# Patient Record
Sex: Male | Born: 1950 | Race: White | Hispanic: No | Marital: Married | Smoking: Former smoker
Health system: Southern US, Community
[De-identification: ages and names within clinical notes are randomized; demographics above are authoritative.]

## PROBLEM LIST (undated history)

## (undated) DIAGNOSIS — N400 Enlarged prostate without lower urinary tract symptoms: Secondary | ICD-10-CM

## (undated) DIAGNOSIS — M353 Polymyalgia rheumatica: Secondary | ICD-10-CM

## (undated) DIAGNOSIS — M1711 Unilateral primary osteoarthritis, right knee: Secondary | ICD-10-CM

## (undated) DIAGNOSIS — M1990 Unspecified osteoarthritis, unspecified site: Secondary | ICD-10-CM

## (undated) HISTORY — PX: MENISCECTOMY: SHX123

## (undated) HISTORY — PX: APPENDECTOMY: SHX54

---

## 2013-05-10 ENCOUNTER — Emergency Department (HOSPITAL_BASED_OUTPATIENT_CLINIC_OR_DEPARTMENT_OTHER)
Admission: EM | Admit: 2013-05-10 | Discharge: 2013-05-10 | Disposition: A | Discharge status: Home / Self Care | Payer: Managed Care, Other (non HMO) | Attending: Emergency Medicine | Admitting: Emergency Medicine

## 2013-05-10 DIAGNOSIS — S61209A Unspecified open wound of unspecified finger without damage to nail, initial encounter: Secondary | ICD-10-CM | POA: Insufficient documentation

## 2013-05-10 DIAGNOSIS — Z7982 Long term (current) use of aspirin: Secondary | ICD-10-CM | POA: Insufficient documentation

## 2013-05-10 DIAGNOSIS — W278XXA Contact with other nonpowered hand tool, initial encounter: Secondary | ICD-10-CM | POA: Insufficient documentation

## 2013-05-10 DIAGNOSIS — Y929 Unspecified place or not applicable: Secondary | ICD-10-CM | POA: Insufficient documentation

## 2013-05-10 DIAGNOSIS — Y939 Activity, unspecified: Secondary | ICD-10-CM | POA: Insufficient documentation

## 2013-05-10 DIAGNOSIS — S61210A Laceration without foreign body of right index finger without damage to nail, initial encounter: Secondary | ICD-10-CM

## 2013-05-10 DIAGNOSIS — Z880 Allergy status to penicillin: Secondary | ICD-10-CM | POA: Insufficient documentation

## 2013-05-10 NOTE — ED Provider Notes (Signed)
CSN: [id number]     Arrival date & time 05/10/13  1600 History   First MD Initiated Contact with Patient 05/10/13 1730     Chief Complaint  Patient presents with  . Laceration   (Consider location/radiation/quality/duration/timing/severity/associated sxs/prior Treatment) Patient is a 62 y.o. male presenting with skin laceration. The history is provided by the patient. No language interpreter was used.  Laceration Location:  Finger Finger laceration location:  R middle finger Length (cm):  0.4 Depth:  Cutaneous Bleeding: controlled   Injury mechanism: plastic edge. Pain details:    Severity:  No pain Relieved by:  Nothing   No past medical history on file. No past surgical history on file. No family history on file. History  Substance Use Topics  . Smoking status: Not on file  . Smokeless tobacco: Not on file  . Alcohol Use: Not on file    Review of Systems  All other systems reviewed and are negative.    Allergies  Amoxicillin  Home Medications   Current Outpatient Rx  Name  Route  Sig  Dispense  Refill  . aspirin 81 MG tablet   Oral   Take 81 mg by mouth daily.          BP 146/90  Pulse 57  Resp 18  SpO2 99% Physical Exam  Vitals reviewed. Constitutional: He is oriented to person, place, and time. He appears well-developed and well-nourished.  Musculoskeletal:  5mm laceration side of finger, no gapping, bleeding controlled  Neurological: He is alert and oriented to person, place, and time. He has normal reflexes.  Psychiatric: He has a normal mood and affect.    ED Course  LACERATION REPAIR Date/Time: 05/10/2013 5:48 PM Performed by: Elson Areas Authorized by: Elson Areas Consent: Verbal consent not obtained. Risks and benefits: risks, benefits and alternatives were discussed Patient identity confirmed: verbally with patient Laceration length: 0.5 cm Tendon involvement: none Nerve involvement: none Skin closure: glue Patient  tolerance: Patient tolerated the procedure well with no immediate complications.   (including critical care time) Labs Review Labs Reviewed - No data to display Imaging Review No results found.  EKG Interpretation   None       MDM   1. Laceration of right index finger w/o foreign body w/o damage to nail, initial encounter       Elson Areas, PA-C 05/10/13 1752

## 2013-05-10 NOTE — ED Notes (Signed)
Patient with small lac to right middle finger, states he came to the ER due to unable to control bleeding at home.

## 2013-05-10 NOTE — ED Notes (Signed)
Patient here with right hand middle finger laceration after cutting same with screw driver. No bleeding on arrival

## 2013-05-11 NOTE — ED Provider Notes (Signed)
Medical screening examination/treatment/procedure(s) were performed by non-physician practitioner and as supervising physician I was immediately available for consultation/collaboration.  EKG Interpretation   None        Shon Baton, MD 05/11/13 1447

## 2013-05-18 HISTORY — PX: ELBOW SURGERY: SHX618

## 2015-09-24 DIAGNOSIS — M1711 Unilateral primary osteoarthritis, right knee: Secondary | ICD-10-CM | POA: Diagnosis not present

## 2015-10-07 DIAGNOSIS — E782 Mixed hyperlipidemia: Secondary | ICD-10-CM | POA: Diagnosis not present

## 2015-12-30 DIAGNOSIS — L821 Other seborrheic keratosis: Secondary | ICD-10-CM | POA: Diagnosis not present

## 2015-12-30 DIAGNOSIS — L820 Inflamed seborrheic keratosis: Secondary | ICD-10-CM | POA: Diagnosis not present

## 2015-12-30 DIAGNOSIS — I781 Nevus, non-neoplastic: Secondary | ICD-10-CM | POA: Diagnosis not present

## 2016-08-03 DIAGNOSIS — R972 Elevated prostate specific antigen [PSA]: Secondary | ICD-10-CM | POA: Diagnosis not present

## 2016-08-03 DIAGNOSIS — N400 Enlarged prostate without lower urinary tract symptoms: Secondary | ICD-10-CM | POA: Diagnosis not present

## 2016-08-03 DIAGNOSIS — Z6830 Body mass index (BMI) 30.0-30.9, adult: Secondary | ICD-10-CM | POA: Diagnosis not present

## 2016-08-25 DIAGNOSIS — Z23 Encounter for immunization: Secondary | ICD-10-CM | POA: Diagnosis not present

## 2016-08-25 DIAGNOSIS — Z136 Encounter for screening for cardiovascular disorders: Secondary | ICD-10-CM | POA: Diagnosis not present

## 2016-08-25 DIAGNOSIS — R739 Hyperglycemia, unspecified: Secondary | ICD-10-CM | POA: Diagnosis not present

## 2016-08-25 DIAGNOSIS — E7800 Pure hypercholesterolemia, unspecified: Secondary | ICD-10-CM | POA: Diagnosis not present

## 2016-08-25 DIAGNOSIS — Z87891 Personal history of nicotine dependence: Secondary | ICD-10-CM | POA: Diagnosis not present

## 2016-08-25 DIAGNOSIS — Z0000 Encounter for general adult medical examination without abnormal findings: Secondary | ICD-10-CM | POA: Diagnosis not present

## 2016-08-25 DIAGNOSIS — Z1211 Encounter for screening for malignant neoplasm of colon: Secondary | ICD-10-CM | POA: Diagnosis not present

## 2016-08-25 DIAGNOSIS — R030 Elevated blood-pressure reading, without diagnosis of hypertension: Secondary | ICD-10-CM | POA: Diagnosis not present

## 2016-08-25 DIAGNOSIS — R05 Cough: Secondary | ICD-10-CM | POA: Diagnosis not present

## 2016-08-25 DIAGNOSIS — E669 Obesity, unspecified: Secondary | ICD-10-CM | POA: Diagnosis not present

## 2016-08-25 DIAGNOSIS — Z125 Encounter for screening for malignant neoplasm of prostate: Secondary | ICD-10-CM | POA: Diagnosis not present

## 2016-08-26 DIAGNOSIS — H524 Presbyopia: Secondary | ICD-10-CM | POA: Diagnosis not present

## 2016-08-26 DIAGNOSIS — H2513 Age-related nuclear cataract, bilateral: Secondary | ICD-10-CM | POA: Diagnosis not present

## 2016-08-26 DIAGNOSIS — H52203 Unspecified astigmatism, bilateral: Secondary | ICD-10-CM | POA: Diagnosis not present

## 2016-09-23 DIAGNOSIS — Z1211 Encounter for screening for malignant neoplasm of colon: Secondary | ICD-10-CM | POA: Diagnosis not present

## 2016-10-22 DIAGNOSIS — Z87891 Personal history of nicotine dependence: Secondary | ICD-10-CM | POA: Diagnosis not present

## 2016-10-28 DIAGNOSIS — M1711 Unilateral primary osteoarthritis, right knee: Secondary | ICD-10-CM | POA: Diagnosis not present

## 2016-12-17 DIAGNOSIS — M1711 Unilateral primary osteoarthritis, right knee: Secondary | ICD-10-CM | POA: Diagnosis not present

## 2016-12-23 DIAGNOSIS — M1711 Unilateral primary osteoarthritis, right knee: Secondary | ICD-10-CM | POA: Diagnosis not present

## 2016-12-30 DIAGNOSIS — D7589 Other specified diseases of blood and blood-forming organs: Secondary | ICD-10-CM | POA: Diagnosis not present

## 2016-12-30 DIAGNOSIS — M791 Myalgia: Secondary | ICD-10-CM | POA: Diagnosis not present

## 2016-12-31 DIAGNOSIS — M1711 Unilateral primary osteoarthritis, right knee: Secondary | ICD-10-CM | POA: Diagnosis not present

## 2017-01-28 DIAGNOSIS — M791 Myalgia: Secondary | ICD-10-CM | POA: Diagnosis not present

## 2017-01-28 DIAGNOSIS — Z79899 Other long term (current) drug therapy: Secondary | ICD-10-CM | POA: Diagnosis not present

## 2017-01-28 DIAGNOSIS — M2560 Stiffness of unspecified joint, not elsewhere classified: Secondary | ICD-10-CM | POA: Diagnosis not present

## 2017-01-28 DIAGNOSIS — Z9189 Other specified personal risk factors, not elsewhere classified: Secondary | ICD-10-CM | POA: Diagnosis not present

## 2017-01-28 DIAGNOSIS — M353 Polymyalgia rheumatica: Secondary | ICD-10-CM | POA: Diagnosis not present

## 2017-03-03 DIAGNOSIS — M1711 Unilateral primary osteoarthritis, right knee: Secondary | ICD-10-CM | POA: Diagnosis not present

## 2017-03-03 DIAGNOSIS — S80211A Abrasion, right knee, initial encounter: Secondary | ICD-10-CM | POA: Diagnosis not present

## 2017-03-22 DIAGNOSIS — M353 Polymyalgia rheumatica: Secondary | ICD-10-CM | POA: Diagnosis not present

## 2017-03-22 DIAGNOSIS — M1711 Unilateral primary osteoarthritis, right knee: Secondary | ICD-10-CM | POA: Diagnosis not present

## 2017-03-22 DIAGNOSIS — Z79899 Other long term (current) drug therapy: Secondary | ICD-10-CM | POA: Diagnosis not present

## 2017-03-22 DIAGNOSIS — Z7952 Long term (current) use of systemic steroids: Secondary | ICD-10-CM | POA: Diagnosis not present

## 2017-06-22 DIAGNOSIS — M79641 Pain in right hand: Secondary | ICD-10-CM | POA: Diagnosis not present

## 2017-06-22 DIAGNOSIS — M1812 Unilateral primary osteoarthritis of first carpometacarpal joint, left hand: Secondary | ICD-10-CM | POA: Diagnosis not present

## 2017-06-22 DIAGNOSIS — Z79899 Other long term (current) drug therapy: Secondary | ICD-10-CM | POA: Diagnosis not present

## 2017-06-22 DIAGNOSIS — M79642 Pain in left hand: Secondary | ICD-10-CM | POA: Diagnosis not present

## 2017-06-22 DIAGNOSIS — M1811 Unilateral primary osteoarthritis of first carpometacarpal joint, right hand: Secondary | ICD-10-CM | POA: Diagnosis not present

## 2017-06-22 DIAGNOSIS — M353 Polymyalgia rheumatica: Secondary | ICD-10-CM | POA: Diagnosis not present

## 2017-08-24 DIAGNOSIS — M1711 Unilateral primary osteoarthritis, right knee: Secondary | ICD-10-CM | POA: Diagnosis not present

## 2017-09-07 DIAGNOSIS — M1711 Unilateral primary osteoarthritis, right knee: Secondary | ICD-10-CM | POA: Diagnosis not present

## 2017-09-14 DIAGNOSIS — M1711 Unilateral primary osteoarthritis, right knee: Secondary | ICD-10-CM | POA: Diagnosis not present

## 2017-09-14 DIAGNOSIS — H2513 Age-related nuclear cataract, bilateral: Secondary | ICD-10-CM | POA: Diagnosis not present

## 2017-09-14 DIAGNOSIS — N138 Other obstructive and reflux uropathy: Secondary | ICD-10-CM | POA: Diagnosis not present

## 2017-09-14 DIAGNOSIS — H524 Presbyopia: Secondary | ICD-10-CM | POA: Diagnosis not present

## 2017-09-14 DIAGNOSIS — H52203 Unspecified astigmatism, bilateral: Secondary | ICD-10-CM | POA: Diagnosis not present

## 2017-09-14 DIAGNOSIS — R972 Elevated prostate specific antigen [PSA]: Secondary | ICD-10-CM | POA: Diagnosis not present

## 2017-09-14 DIAGNOSIS — N401 Enlarged prostate with lower urinary tract symptoms: Secondary | ICD-10-CM | POA: Diagnosis not present

## 2017-10-05 DIAGNOSIS — M150 Primary generalized (osteo)arthritis: Secondary | ICD-10-CM | POA: Diagnosis not present

## 2017-10-05 DIAGNOSIS — Z79899 Other long term (current) drug therapy: Secondary | ICD-10-CM | POA: Diagnosis not present

## 2017-10-05 DIAGNOSIS — M353 Polymyalgia rheumatica: Secondary | ICD-10-CM | POA: Diagnosis not present

## 2017-12-16 DIAGNOSIS — D1801 Hemangioma of skin and subcutaneous tissue: Secondary | ICD-10-CM | POA: Diagnosis not present

## 2017-12-16 DIAGNOSIS — L570 Actinic keratosis: Secondary | ICD-10-CM | POA: Diagnosis not present

## 2017-12-16 DIAGNOSIS — L821 Other seborrheic keratosis: Secondary | ICD-10-CM | POA: Diagnosis not present

## 2017-12-16 DIAGNOSIS — L814 Other melanin hyperpigmentation: Secondary | ICD-10-CM | POA: Diagnosis not present

## 2017-12-16 DIAGNOSIS — D225 Melanocytic nevi of trunk: Secondary | ICD-10-CM | POA: Diagnosis not present

## 2017-12-30 DIAGNOSIS — E669 Obesity, unspecified: Secondary | ICD-10-CM | POA: Diagnosis not present

## 2018-01-03 DIAGNOSIS — M353 Polymyalgia rheumatica: Secondary | ICD-10-CM | POA: Diagnosis not present

## 2018-01-03 DIAGNOSIS — Z79899 Other long term (current) drug therapy: Secondary | ICD-10-CM | POA: Diagnosis not present

## 2018-01-03 DIAGNOSIS — M150 Primary generalized (osteo)arthritis: Secondary | ICD-10-CM | POA: Diagnosis not present

## 2018-01-04 DIAGNOSIS — E7800 Pure hypercholesterolemia, unspecified: Secondary | ICD-10-CM | POA: Diagnosis not present

## 2018-01-04 DIAGNOSIS — Z0000 Encounter for general adult medical examination without abnormal findings: Secondary | ICD-10-CM | POA: Diagnosis not present

## 2018-01-04 DIAGNOSIS — Z23 Encounter for immunization: Secondary | ICD-10-CM | POA: Diagnosis not present

## 2018-01-04 DIAGNOSIS — Z6832 Body mass index (BMI) 32.0-32.9, adult: Secondary | ICD-10-CM | POA: Diagnosis not present

## 2018-01-04 DIAGNOSIS — N138 Other obstructive and reflux uropathy: Secondary | ICD-10-CM | POA: Diagnosis not present

## 2018-01-04 DIAGNOSIS — R739 Hyperglycemia, unspecified: Secondary | ICD-10-CM | POA: Diagnosis not present

## 2018-01-04 DIAGNOSIS — M353 Polymyalgia rheumatica: Secondary | ICD-10-CM | POA: Diagnosis not present

## 2018-01-04 DIAGNOSIS — E669 Obesity, unspecified: Secondary | ICD-10-CM | POA: Diagnosis not present

## 2018-01-04 DIAGNOSIS — N401 Enlarged prostate with lower urinary tract symptoms: Secondary | ICD-10-CM | POA: Diagnosis not present

## 2018-02-09 DIAGNOSIS — M1711 Unilateral primary osteoarthritis, right knee: Secondary | ICD-10-CM | POA: Diagnosis not present

## 2018-04-11 DIAGNOSIS — D485 Neoplasm of uncertain behavior of skin: Secondary | ICD-10-CM | POA: Diagnosis not present

## 2018-04-11 DIAGNOSIS — L820 Inflamed seborrheic keratosis: Secondary | ICD-10-CM | POA: Diagnosis not present

## 2018-04-11 DIAGNOSIS — L821 Other seborrheic keratosis: Secondary | ICD-10-CM | POA: Diagnosis not present

## 2018-04-11 NOTE — Pre-Procedure Instructions (Signed)
Frank Hudson  04/11/2018      [id number] - [address], [place] - [place] [address] Phone: [phone] Fax: [phone]    Your procedure is scheduled on 04/22/2018.  Report to [facility] Admitting at [place].M.  Call this number if you have problems the morning of surgery:  [phone]   Remember:  Do not eat or drink after midnight.    Take these medicines the morning of surgery with A SIP OF WATER: Dutasteride (avodart) - if it is a regularly scheduled day  7 days prior to surgery STOP taking any Aspirin(unless otherwise instructed by your surgeon), Aleve, Naproxen, Ibuprofen, Motrin, Advil, Goody's, BC's, all herbal medications, fish oil, and all vitamins     Do not wear jewelry  Do not wear lotions, powders, or colognes, or deodorant.  Men may shave face and neck.  Do not bring valuables to the hospital.  [facility] is not responsible for any belongings or valuables.  Contacts, eyeglasses, hearing aids, dentures or bridgework may not be worn into surgery.  Leave your suitcase in the car.  After surgery it may be brought to your room.  For patients admitted to the hospital, discharge time will be determined by your treatment team.  Patients discharged the day of surgery will not be allowed to drive home.   Name and phone number of your driver:    Special instructions:   [place]- Preparing For Surgery  Before surgery, you can play an important role. Because skin is not sterile, your skin needs to be as free of germs as possible. You can reduce the number of germs on your skin by washing with CHG (chlorahexidine gluconate) Soap before surgery.  CHG is an antiseptic cleaner which kills germs and bonds with the skin to continue killing germs even after washing.    Oral Hygiene is also important to reduce your risk of infection.  Remember - BRUSH YOUR TEETH THE MORNING OF SURGERY WITH YOUR REGULAR  TOOTHPASTE  Please do not use if you have an allergy to CHG or antibacterial soaps. If your skin becomes reddened/irritated stop using the CHG.  Do not shave (including legs and underarms) for at least 48 hours prior to first CHG shower. It is OK to shave your face.  Please follow these instructions carefully.   1. Shower the NIGHT BEFORE SURGERY and the MORNING OF SURGERY with CHG.   2. If you chose to wash your hair, wash your hair first as usual with your normal shampoo.  3. After you shampoo, rinse your hair and body thoroughly to remove the shampoo.  4. Use CHG as you would any other liquid soap. You can apply CHG directly to the skin and wash gently with a scrungie or a clean washcloth.   5. Apply the CHG Soap to your body ONLY FROM THE NECK DOWN.  Do not use on open wounds or open sores. Avoid contact with your eyes, ears, mouth and genitals (private parts). Wash Face and genitals (private parts)  with your normal soap.  6. Wash thoroughly, paying special attention to the area where your surgery will be performed.  7. Thoroughly rinse your body with warm water from the neck down.  8. DO NOT shower/wash with your normal soap after using and rinsing off the CHG Soap.  9. Pat yourself dry with a CLEAN TOWEL.  10. Wear CLEAN PAJAMAS to bed the night before surgery, wear comfortable  clothes the morning of surgery  11. Place CLEAN SHEETS on your bed the night of your first shower and DO NOT SLEEP WITH PETS.    Day of Surgery:  Do not apply any deodorants/lotions.  Please wear clean clothes to the hospital/surgery center.   Remember to brush your teeth WITH YOUR REGULAR TOOTHPASTE.    Please read over the following fact sheets that you were given.

## 2018-04-12 ENCOUNTER — Other Ambulatory Visit: Payer: Self-pay

## 2018-04-12 ENCOUNTER — Encounter (HOSPITAL_COMMUNITY): Payer: Self-pay

## 2018-04-12 ENCOUNTER — Encounter (HOSPITAL_COMMUNITY)
Admission: RE | Admit: 2018-04-12 | Discharge: 2018-04-12 | Disposition: A | Discharge status: Home / Self Care | Payer: PPO | Source: Ambulatory Visit | Attending: Orthopedic Surgery | Admitting: Orthopedic Surgery

## 2018-04-12 DIAGNOSIS — Z01812 Encounter for preprocedural laboratory examination: Secondary | ICD-10-CM | POA: Diagnosis not present

## 2018-04-12 DIAGNOSIS — M1711 Unilateral primary osteoarthritis, right knee: Secondary | ICD-10-CM | POA: Diagnosis not present

## 2018-04-12 HISTORY — DX: Polymyalgia rheumatica: M35.3

## 2018-04-12 HISTORY — DX: Unspecified osteoarthritis, unspecified site: M19.90

## 2018-04-12 HISTORY — DX: Benign prostatic hyperplasia without lower urinary tract symptoms: N40.0

## 2018-04-12 LAB — CBC
HCT: 46.4 % (ref 39.0–52.0)
Hemoglobin: 15.3 g/dL (ref 13.0–17.0)
MCH: 31.1 pg (ref 26.0–34.0)
MCHC: 33 g/dL (ref 30.0–36.0)
MCV: 94.3 fL (ref 80.0–100.0)
NRBC: 0 % (ref 0.0–0.2)
Platelets: 192 10*3/uL (ref 150–400)
RBC: 4.92 MIL/uL (ref 4.22–5.81)
RDW: 12.7 % (ref 11.5–15.5)
WBC: 6.1 10*3/uL (ref 4.0–10.5)

## 2018-04-12 LAB — SURGICAL PCR SCREEN
MRSA, PCR: NEGATIVE
Staphylococcus aureus: NEGATIVE

## 2018-04-12 NOTE — Progress Notes (Signed)
PCP - Dr. Jeralene Huff Cardiologist - patient denies  Chest x-ray - n/a EKG - n/a Stress Test - 10+ years ago, normal test, requiring no follow up per patient, not sure who ordered it or where it was done ECHO - patient denies Cardiac Cath - patient denies  Sleep Study - patient denies  Anesthesia review: n/a  Patient denies shortness of breath, fever, cough and chest pain at PAT appointment   Patient verbalized understanding of instructions that were given to them at the PAT appointment. Patient was also instructed that they will need to review over the PAT instructions again at home before surgery.

## 2018-04-13 NOTE — H&P (Signed)
Patient's anticipated LOS is less than 2 midnights, meeting these requirements: - Younger than 2 - Lives within 1 hour of care - Has a competent adult at home to recover with post-op recover - NO history of  - Chronic pain requiring opiods  - Diabetes  - Coronary Artery Disease  - Heart failure  - Heart attack  - Stroke  - DVT/VTE  - Cardiac arrhythmia  - Respiratory Failure/COPD  - Renal failure  - Anemia  - Advanced Liver disease       Frank Hudson is an 67 y.o. male.    Chief Complaint: right shoulder pain  HPI: Pt is a 67 y.o. male complaining of right knee pain for multiple years. Pain had continually increased since the beginning. X-rays in the clinic show end-stage arthritic changes of the right knee. Pt has tried various conservative treatments which have failed to alleviate their symptoms, including injections and therapy. Various options are discussed with the patient. Risks, benefits and expectations were discussed with the patient. Patient understand the risks, benefits and expectations and wishes to proceed with surgery.   PCP:  [name], MD  D/C Plans: Home  PMH: Past Medical History:  Diagnosis Date  . Arthritis   . BPH (benign prostatic hyperplasia)   . PMR (polymyalgia rheumatica) (HCC)     PSH: Past Surgical History:  Procedure Laterality Date  . APPENDECTOMY    . ELBOW SURGERY Left 2015  . MENISCECTOMY Right 1978?    Social History:  reports that he quit smoking about 15 years ago. His smoking use included cigarettes and cigars. He has never used smokeless tobacco. He reports that he drinks about 14.0 standard drinks of alcohol per week. His drug history is not on file.  Allergies:  Allergies  Allergen Reactions  . Amoxicillin Rash    Medications: No current facility-administered medications for this encounter.    Current Outpatient Medications  Medication Sig Dispense Refill  . dutasteride (AVODART) 0.5 MG capsule Take 0.5  mg by mouth every other day.    . ibuprofen (ADVIL,MOTRIN) 200 MG tablet Take 400-600 mg by mouth every 6 (six) hours as needed for headache or moderate pain.    . naproxen sodium (ALEVE) 220 MG tablet Take 220 mg by mouth daily as needed (for pain or headache).    . Multiple Vitamins-Minerals (MULTIVITAMIN PO) Take 1 tablet by mouth daily.      Results for orders placed or performed during the hospital encounter of 04/12/18 (from the past 48 hour(s))  Surgical pcr screen     Status: None   Collection Time: 04/12/18 10:37 AM  Result Value Ref Range   MRSA, PCR NEGATIVE NEGATIVE   Staphylococcus aureus NEGATIVE NEGATIVE    Comment: (NOTE) The Xpert SA Assay (FDA approved for NASAL specimens in patients 56 years of age and older), is one component of a comprehensive surveillance program. It is not intended to diagnose infection nor to guide or monitor treatment. Performed at [facility], [place] [address], [place] [address]   CBC     Status: None   Collection Time: 04/12/18 10:37 AM  Result Value Ref Range   WBC 6.1 4.0 - 10.5 K/uL   RBC 4.92 4.22 - 5.81 MIL/uL   Hemoglobin 15.3 13.0 - 17.0 g/dL   HCT 46.4 39.0 - 52.0 %   MCV 94.3 80.0 - 100.0 fL   MCH 31.1 26.0 - 34.0 pg   MCHC 33.0 30.0 - 36.0 g/dL  RDW 12.7 11.5 - 15.5 %   Platelets 192 150 - 400 K/uL   nRBC 0.0 0.0 - 0.2 %    Comment: Performed at [facility], [place] [address] [place], [place] [address]   No results found.  ROS: Pain with rom of the right lower extremity  Physical Exam: Alert and oriented 67 y.o. male in no acute distress Cranial nerves 2-12 intact Cervical spine: full rom with no tenderness, nv intact distally Chest: active breath sounds bilaterally, no wheeze rhonchi or rales Heart: regular rate and rhythm, no murmur Abd: non tender non distended with active bowel sounds Hip is stable with rom  Right knee moderate medial and lateral joint line tenderness nv intact distally No  rashes or edema Antalgic gait  Assessment/Plan Assessment: right knee end stage osteoarthritis  Plan:  Patient will undergo a right total knee by Dr. Veverly Fells at [facility]. Risks benefits and expectations were discussed with the patient. Patient understand risks, benefits and expectations and wishes to proceed. Preoperative templating of the joint replacement has been completed, documented, and submitted to the Operating Room personnel in order to optimize intra-operative equipment management.   [name] PA-C, MPAS [facility] is now [facility] [address], [place], [place], [place] [address] Phone: [phone] [email or web address] Facebook  [employer]

## 2018-04-21 MED ORDER — TRANEXAMIC ACID-NACL 1000-0.7 MG/100ML-% IV SOLN
1000.0000 mg | INTRAVENOUS | Status: AC
  Administered 2018-04-22: 1000 mg via INTRAVENOUS
  Filled 2018-04-21: qty 100

## 2018-04-22 ENCOUNTER — Encounter (HOSPITAL_COMMUNITY)
Admission: RE | Disposition: A | Discharge status: Home Health | Payer: Self-pay | Source: Home / Self Care | Attending: Orthopedic Surgery

## 2018-04-22 ENCOUNTER — Inpatient Hospital Stay (HOSPITAL_COMMUNITY): Payer: PPO | Admitting: Anesthesiology

## 2018-04-22 ENCOUNTER — Encounter (HOSPITAL_COMMUNITY): Payer: Self-pay

## 2018-04-22 ENCOUNTER — Inpatient Hospital Stay (HOSPITAL_COMMUNITY)
Admission: RE | Admit: 2018-04-22 | Discharge: 2018-04-24 | DRG: 470 | Disposition: A | Discharge status: Home Health | Payer: PPO | Attending: Orthopedic Surgery | Admitting: Orthopedic Surgery

## 2018-04-22 ENCOUNTER — Inpatient Hospital Stay (HOSPITAL_COMMUNITY): Payer: PPO

## 2018-04-22 ENCOUNTER — Other Ambulatory Visit: Payer: Self-pay

## 2018-04-22 DIAGNOSIS — N400 Enlarged prostate without lower urinary tract symptoms: Secondary | ICD-10-CM | POA: Diagnosis not present

## 2018-04-22 DIAGNOSIS — Z880 Allergy status to penicillin: Secondary | ICD-10-CM

## 2018-04-22 DIAGNOSIS — Z87891 Personal history of nicotine dependence: Secondary | ICD-10-CM

## 2018-04-22 DIAGNOSIS — M1711 Unilateral primary osteoarthritis, right knee: Secondary | ICD-10-CM | POA: Diagnosis not present

## 2018-04-22 DIAGNOSIS — Z79899 Other long term (current) drug therapy: Secondary | ICD-10-CM

## 2018-04-22 DIAGNOSIS — Z471 Aftercare following joint replacement surgery: Secondary | ICD-10-CM | POA: Diagnosis not present

## 2018-04-22 DIAGNOSIS — M25561 Pain in right knee: Secondary | ICD-10-CM | POA: Diagnosis present

## 2018-04-22 DIAGNOSIS — E669 Obesity, unspecified: Secondary | ICD-10-CM | POA: Diagnosis not present

## 2018-04-22 DIAGNOSIS — G8918 Other acute postprocedural pain: Secondary | ICD-10-CM | POA: Diagnosis not present

## 2018-04-22 DIAGNOSIS — Z9049 Acquired absence of other specified parts of digestive tract: Secondary | ICD-10-CM

## 2018-04-22 DIAGNOSIS — I4819 Other persistent atrial fibrillation: Secondary | ICD-10-CM | POA: Diagnosis not present

## 2018-04-22 DIAGNOSIS — R110 Nausea: Secondary | ICD-10-CM | POA: Diagnosis not present

## 2018-04-22 DIAGNOSIS — Z6830 Body mass index (BMI) 30.0-30.9, adult: Secondary | ICD-10-CM | POA: Diagnosis not present

## 2018-04-22 DIAGNOSIS — Z96651 Presence of right artificial knee joint: Secondary | ICD-10-CM | POA: Diagnosis not present

## 2018-04-22 DIAGNOSIS — I4891 Unspecified atrial fibrillation: Secondary | ICD-10-CM | POA: Diagnosis not present

## 2018-04-22 HISTORY — DX: Unilateral primary osteoarthritis, right knee: M17.11

## 2018-04-22 HISTORY — PX: TOTAL KNEE ARTHROPLASTY: SHX125

## 2018-04-22 SURGERY — ARTHROPLASTY, KNEE, TOTAL
Anesthesia: Spinal | Site: Knee | Laterality: Right

## 2018-04-22 MED ORDER — FENTANYL CITRATE (PF) 100 MCG/2ML IJ SOLN
INTRAMUSCULAR | Status: AC
  Administered 2018-04-22: 100 ug via INTRAVENOUS
  Filled 2018-04-22: qty 2

## 2018-04-22 MED ORDER — ASPIRIN 81 MG PO CHEW
81.0000 mg | CHEWABLE_TABLET | Freq: Two times a day (BID) | ORAL | Status: DC
  Administered 2018-04-22 – 2018-04-24 (×4): 81 mg via ORAL
  Filled 2018-04-22 (×4): qty 1

## 2018-04-22 MED ORDER — HYDROMORPHONE HCL 1 MG/ML IJ SOLN: 0.2500 mg | INTRAMUSCULAR | Status: DC | PRN

## 2018-04-22 MED ORDER — HYDROMORPHONE HCL 1 MG/ML IJ SOLN
0.5000 mg | INTRAMUSCULAR | Status: DC | PRN
  Administered 2018-04-22 – 2018-04-23 (×6): 1 mg via INTRAVENOUS
  Filled 2018-04-22 (×6): qty 1

## 2018-04-22 MED ORDER — FENTANYL CITRATE (PF) 250 MCG/5ML IJ SOLN
INTRAMUSCULAR | Status: AC
  Filled 2018-04-22: qty 5

## 2018-04-22 MED ORDER — TRANEXAMIC ACID-NACL 1000-0.7 MG/100ML-% IV SOLN
1000.0000 mg | Freq: Once | INTRAVENOUS | Status: AC
  Administered 2018-04-22: 1000 mg via INTRAVENOUS
  Filled 2018-04-22: qty 100

## 2018-04-22 MED ORDER — PROPOFOL 500 MG/50ML IV EMUL
INTRAVENOUS | Status: DC | PRN
  Administered 2018-04-22: 125 ug/kg/min via INTRAVENOUS

## 2018-04-22 MED ORDER — DOCUSATE SODIUM 100 MG PO CAPS
100.0000 mg | ORAL_CAPSULE | Freq: Two times a day (BID) | ORAL | Status: DC
  Administered 2018-04-22 – 2018-04-24 (×4): 100 mg via ORAL
  Filled 2018-04-22 (×5): qty 1

## 2018-04-22 MED ORDER — METHOCARBAMOL 1000 MG/10ML IJ SOLN
500.0000 mg | Freq: Four times a day (QID) | INTRAVENOUS | Status: DC | PRN
  Filled 2018-04-22: qty 5

## 2018-04-22 MED ORDER — BUPIVACAINE IN DEXTROSE 0.75-8.25 % IT SOLN
INTRATHECAL | Status: DC | PRN
  Administered 2018-04-22: 1.6 mL via INTRATHECAL

## 2018-04-22 MED ORDER — POLYETHYLENE GLYCOL 3350 17 G PO PACK: 17.0000 g | PACK | Freq: Every day | ORAL | Status: DC | PRN

## 2018-04-22 MED ORDER — MENTHOL 3 MG MT LOZG: 1.0000 | LOZENGE | OROMUCOSAL | Status: DC | PRN

## 2018-04-22 MED ORDER — ROPIVACAINE HCL 7.5 MG/ML IJ SOLN
INTRAMUSCULAR | Status: DC | PRN
  Administered 2018-04-22 (×4): 5 mL via PERINEURAL

## 2018-04-22 MED ORDER — OXYCODONE HCL 5 MG PO TABS
5.0000 mg | ORAL_TABLET | ORAL | Status: DC | PRN
  Administered 2018-04-22: 5 mg via ORAL
  Administered 2018-04-22 – 2018-04-23 (×4): 10 mg via ORAL
  Filled 2018-04-22 (×3): qty 2
  Filled 2018-04-22: qty 1
  Filled 2018-04-22: qty 2

## 2018-04-22 MED ORDER — METHOCARBAMOL 500 MG PO TABS
500.0000 mg | ORAL_TABLET | Freq: Four times a day (QID) | ORAL | Status: DC | PRN
  Administered 2018-04-22 – 2018-04-23 (×4): 500 mg via ORAL
  Filled 2018-04-22 (×4): qty 1

## 2018-04-22 MED ORDER — DUTASTERIDE 0.5 MG PO CAPS
0.5000 mg | ORAL_CAPSULE | ORAL | Status: DC
  Administered 2018-04-24: 0.5 mg via ORAL
  Filled 2018-04-22: qty 1

## 2018-04-22 MED ORDER — LACTATED RINGERS IV SOLN
INTRAVENOUS | Status: DC
  Administered 2018-04-22 (×2): via INTRAVENOUS

## 2018-04-22 MED ORDER — ASPIRIN 81 MG PO CHEW: 81.0000 mg | CHEWABLE_TABLET | Freq: Two times a day (BID) | ORAL | 0 refills | Status: DC

## 2018-04-22 MED ORDER — PROMETHAZINE HCL 25 MG/ML IJ SOLN: 6.2500 mg | INTRAMUSCULAR | Status: DC | PRN

## 2018-04-22 MED ORDER — FERROUS SULFATE 325 (65 FE) MG PO TABS
325.0000 mg | ORAL_TABLET | Freq: Three times a day (TID) | ORAL | Status: DC
  Administered 2018-04-22 – 2018-04-24 (×6): 325 mg via ORAL
  Filled 2018-04-22 (×6): qty 1

## 2018-04-22 MED ORDER — CHLORHEXIDINE GLUCONATE 4 % EX LIQD: 60.0000 mL | Freq: Once | CUTANEOUS | Status: DC

## 2018-04-22 MED ORDER — METOCLOPRAMIDE HCL 5 MG/ML IJ SOLN
5.0000 mg | Freq: Three times a day (TID) | INTRAMUSCULAR | Status: DC | PRN
  Administered 2018-04-23: 10 mg via INTRAVENOUS
  Filled 2018-04-22: qty 2

## 2018-04-22 MED ORDER — ACETAMINOPHEN 325 MG PO TABS: 325.0000 mg | ORAL_TABLET | Freq: Four times a day (QID) | ORAL | Status: DC | PRN

## 2018-04-22 MED ORDER — SODIUM CHLORIDE 0.9 % IV SOLN
INTRAVENOUS | Status: DC | PRN
  Administered 2018-04-22: 25 ug/min via INTRAVENOUS

## 2018-04-22 MED ORDER — ONDANSETRON HCL 4 MG/2ML IJ SOLN
INTRAMUSCULAR | Status: DC | PRN
  Administered 2018-04-22: 4 mg via INTRAVENOUS

## 2018-04-22 MED ORDER — MEPERIDINE HCL 50 MG/ML IJ SOLN: 6.2500 mg | INTRAMUSCULAR | Status: DC | PRN

## 2018-04-22 MED ORDER — ONDANSETRON HCL 4 MG/2ML IJ SOLN
4.0000 mg | Freq: Four times a day (QID) | INTRAMUSCULAR | Status: DC | PRN
  Administered 2018-04-23: 4 mg via INTRAVENOUS
  Filled 2018-04-22: qty 2

## 2018-04-22 MED ORDER — NAPROXEN SODIUM 275 MG PO TABS
275.0000 mg | ORAL_TABLET | Freq: Every day | ORAL | Status: DC | PRN
  Filled 2018-04-22: qty 1

## 2018-04-22 MED ORDER — ADULT MULTIVITAMIN LIQUID CH
Freq: Every day | ORAL | Status: DC
  Administered 2018-04-22 – 2018-04-24 (×2): 15 mL via ORAL
  Filled 2018-04-22 (×3): qty 15

## 2018-04-22 MED ORDER — CLINDAMYCIN PHOSPHATE 900 MG/50ML IV SOLN
900.0000 mg | INTRAVENOUS | Status: AC
  Administered 2018-04-22: 900 mg via INTRAVENOUS
  Filled 2018-04-22: qty 50

## 2018-04-22 MED ORDER — PHENOL 1.4 % MT LIQD: 1.0000 | OROMUCOSAL | Status: DC | PRN

## 2018-04-22 MED ORDER — CLINDAMYCIN PHOSPHATE 900 MG/50ML IV SOLN
900.0000 mg | Freq: Three times a day (TID) | INTRAVENOUS | Status: AC
  Administered 2018-04-22 – 2018-04-23 (×3): 900 mg via INTRAVENOUS
  Filled 2018-04-22 (×3): qty 50

## 2018-04-22 MED ORDER — METOCLOPRAMIDE HCL 5 MG PO TABS: 5.0000 mg | ORAL_TABLET | Freq: Three times a day (TID) | ORAL | Status: DC | PRN

## 2018-04-22 MED ORDER — OXYCODONE-ACETAMINOPHEN 5-325 MG PO TABS: 1.0000 | ORAL_TABLET | ORAL | 0 refills | Status: DC | PRN

## 2018-04-22 MED ORDER — ONDANSETRON HCL 4 MG PO TABS: 4.0000 mg | ORAL_TABLET | Freq: Four times a day (QID) | ORAL | Status: DC | PRN

## 2018-04-22 MED ORDER — PROPOFOL 10 MG/ML IV BOLUS
INTRAVENOUS | Status: AC
  Filled 2018-04-22: qty 20

## 2018-04-22 MED ORDER — BISACODYL 10 MG RE SUPP: 10.0000 mg | Freq: Every day | RECTAL | Status: DC | PRN

## 2018-04-22 MED ORDER — PROPOFOL 1000 MG/100ML IV EMUL
INTRAVENOUS | Status: AC
  Filled 2018-04-22: qty 200

## 2018-04-22 MED ORDER — APIXABAN 5 MG PO TABS
5.0000 mg | ORAL_TABLET | Freq: Two times a day (BID) | ORAL | Status: DC
  Administered 2018-04-23 – 2018-04-24 (×3): 5 mg via ORAL
  Filled 2018-04-22 (×3): qty 1

## 2018-04-22 MED ORDER — FENTANYL CITRATE (PF) 100 MCG/2ML IJ SOLN
100.0000 ug | Freq: Once | INTRAMUSCULAR | Status: AC
  Administered 2018-04-22: 100 ug via INTRAVENOUS

## 2018-04-22 MED ORDER — KETOROLAC TROMETHAMINE 30 MG/ML IJ SOLN: 30.0000 mg | Freq: Once | INTRAMUSCULAR | Status: DC | PRN

## 2018-04-22 MED ORDER — METHOCARBAMOL 500 MG PO TABS: 500.0000 mg | ORAL_TABLET | Freq: Three times a day (TID) | ORAL | 1 refills | Status: DC | PRN

## 2018-04-22 MED ORDER — SODIUM CHLORIDE 0.9 % IV SOLN
INTRAVENOUS | Status: DC
  Administered 2018-04-22 – 2018-04-23 (×2): via INTRAVENOUS

## 2018-04-22 MED ORDER — ROPIVACAINE HCL 5 MG/ML IJ SOLN
INTRAMUSCULAR | Status: DC | PRN
  Administered 2018-04-22 (×2): 5 mL via PERINEURAL

## 2018-04-22 MED ORDER — 0.9 % SODIUM CHLORIDE (POUR BTL) OPTIME
TOPICAL | Status: DC | PRN
  Administered 2018-04-22: 1000 mL

## 2018-04-22 MED ORDER — ONDANSETRON HCL 4 MG/2ML IJ SOLN
INTRAMUSCULAR | Status: AC
  Filled 2018-04-22: qty 2

## 2018-04-22 MED ORDER — MIDAZOLAM HCL 2 MG/2ML IJ SOLN
2.0000 mg | Freq: Once | INTRAMUSCULAR | Status: AC
  Administered 2018-04-22: 2 mg via INTRAVENOUS

## 2018-04-22 MED ORDER — SODIUM CHLORIDE 0.9 % IR SOLN
Status: DC | PRN
  Administered 2018-04-22: 3000 mL

## 2018-04-22 MED ORDER — MIDAZOLAM HCL 2 MG/2ML IJ SOLN
INTRAMUSCULAR | Status: AC
  Administered 2018-04-22: 2 mg via INTRAVENOUS
  Filled 2018-04-22: qty 2

## 2018-04-22 SURGICAL SUPPLY — 60 items
BANDAGE ESMARK 6X9 LF (GAUZE/BANDAGES/DRESSINGS) ×1 IMPLANT
BLADE SAG 18X100X1.27 (BLADE) ×3 IMPLANT
BLADE SAW SGTL 13X75X1.27 (BLADE) ×3 IMPLANT
BNDG ELASTIC 6X10 VLCR STRL LF (GAUZE/BANDAGES/DRESSINGS) ×3 IMPLANT
BNDG ESMARK 6X9 LF (GAUZE/BANDAGES/DRESSINGS) ×3
BNDG GAUZE ELAST 4 BULKY (GAUZE/BANDAGES/DRESSINGS) ×3 IMPLANT
BOWL SMART MIX CTS (DISPOSABLE) ×3 IMPLANT
CEMENT HV SMART SET (Cement) ×6 IMPLANT
CEMENT TIBIA MBT SIZE 5 (Knees) ×1 IMPLANT
CLOSURE WOUND 1/2 X4 (GAUZE/BANDAGES/DRESSINGS) ×1
COVER SURGICAL LIGHT HANDLE (MISCELLANEOUS) ×3 IMPLANT
COVER WAND RF STERILE (DRAPES) ×3 IMPLANT
CUFF TOURNIQUET SINGLE 34IN LL (TOURNIQUET CUFF) IMPLANT
CUFF TOURNIQUET SINGLE 44IN (TOURNIQUET CUFF) IMPLANT
DRAPE EXTREMITY T 121X128X90 (DRAPE) ×3 IMPLANT
DRAPE HALF SHEET 40X57 (DRAPES) ×3 IMPLANT
DRAPE U-SHAPE 47X51 STRL (DRAPES) ×3 IMPLANT
DRSG ADAPTIC 3X8 NADH LF (GAUZE/BANDAGES/DRESSINGS) ×3 IMPLANT
DRSG PAD ABDOMINAL 8X10 ST (GAUZE/BANDAGES/DRESSINGS) ×6 IMPLANT
DURAPREP 26ML APPLICATOR (WOUND CARE) ×3 IMPLANT
ELECT CAUTERY BLADE 6.4 (BLADE) ×3 IMPLANT
ELECT REM PT RETURN 9FT ADLT (ELECTROSURGICAL) ×3
ELECTRODE REM PT RTRN 9FT ADLT (ELECTROSURGICAL) ×1 IMPLANT
FEMUR SIGMA PS SZ 5.0 R (Femur) ×3 IMPLANT
GAUZE SPONGE 4X4 12PLY STRL (GAUZE/BANDAGES/DRESSINGS) ×3 IMPLANT
GLOVE BIOGEL PI ORTHO PRO 7.5 (GLOVE) ×2
GLOVE BIOGEL PI ORTHO PRO SZ8 (GLOVE) ×2
GLOVE ORTHO TXT STRL SZ7.5 (GLOVE) ×3 IMPLANT
GLOVE PI ORTHO PRO STRL 7.5 (GLOVE) ×1 IMPLANT
GLOVE PI ORTHO PRO STRL SZ8 (GLOVE) ×1 IMPLANT
GLOVE SURG ORTHO 8.5 STRL (GLOVE) ×3 IMPLANT
GOWN STRL REUS W/ TWL XL LVL3 (GOWN DISPOSABLE) ×3 IMPLANT
GOWN STRL REUS W/TWL XL LVL3 (GOWN DISPOSABLE) ×6
HANDPIECE INTERPULSE COAX TIP (DISPOSABLE) ×2
IMMOBILIZER KNEE 22 UNIV (SOFTGOODS) ×3 IMPLANT
KIT BASIN OR (CUSTOM PROCEDURE TRAY) ×3 IMPLANT
KIT TURNOVER KIT B (KITS) ×3 IMPLANT
MANIFOLD NEPTUNE II (INSTRUMENTS) ×3 IMPLANT
NS IRRIG 1000ML POUR BTL (IV SOLUTION) ×3 IMPLANT
PACK TOTAL JOINT (CUSTOM PROCEDURE TRAY) ×3 IMPLANT
PAD ABD 8X10 STRL (GAUZE/BANDAGES/DRESSINGS) ×3 IMPLANT
PAD ARMBOARD 7.5X6 YLW CONV (MISCELLANEOUS) ×6 IMPLANT
PATELLA DOME PFC 38MM (Knees) ×3 IMPLANT
PIN STEINMAN FIXATION KNEE (PIN) ×6 IMPLANT
PLATE ROT INSERT 12.5MM (Plate) ×3 IMPLANT
SET HNDPC FAN SPRY TIP SCT (DISPOSABLE) ×1 IMPLANT
STRIP CLOSURE SKIN 1/2X4 (GAUZE/BANDAGES/DRESSINGS) ×2 IMPLANT
SUCTION FRAZIER HANDLE 10FR (MISCELLANEOUS) ×2
SUCTION TUBE FRAZIER 10FR DISP (MISCELLANEOUS) ×1 IMPLANT
SUT MNCRL AB 3-0 PS2 18 (SUTURE) ×3 IMPLANT
SUT VIC AB 0 CT1 27 (SUTURE) ×4
SUT VIC AB 0 CT1 27XBRD ANBCTR (SUTURE) ×2 IMPLANT
SUT VIC AB 1 CT1 27 (SUTURE) ×6
SUT VIC AB 1 CT1 27XBRD ANBCTR (SUTURE) ×3 IMPLANT
SUT VIC AB 2-0 CT1 27 (SUTURE) ×4
SUT VIC AB 2-0 CT1 TAPERPNT 27 (SUTURE) ×2 IMPLANT
TIBIA MBT CEMENT SIZE 5 (Knees) ×3 IMPLANT
TOWEL OR 17X26 10 PK STRL BLUE (TOWEL DISPOSABLE) ×3 IMPLANT
TRAY CATH 16FR W/PLASTIC CATH (SET/KITS/TRAYS/PACK) IMPLANT
TRAY FOLEY MTR SLVR 16FR STAT (SET/KITS/TRAYS/PACK) IMPLANT

## 2018-04-22 NOTE — Brief Op Note (Signed)
04/22/2018  12:58 PM  PATIENT:  Frank Hudson  67 y.o. male  PRE-OPERATIVE DIAGNOSIS:  Right knee end stage osteoarthritis  POST-OPERATIVE DIAGNOSIS:  Right knee end stage osteoarthritis  PROCEDURE:  Procedure(s): RIGHT TOTAL KNEE ARTHROPLASTY (Right) DePuy Sigma RP  SURGEON:  Surgeon(s) and Role:    [name], MD - Primary  PHYSICIAN ASSISTANT:   ASSISTANTS: Ventura Bruns, PA-C   ANESTHESIA:   regional and spinal  EBL:  50 mL   BLOOD ADMINISTERED:none  DRAINS: none   LOCAL MEDICATIONS USED:  NONE  SPECIMEN:  No Specimen  DISPOSITION OF SPECIMEN:  N/A  COUNTS:  YES  TOURNIQUET:   Total Tourniquet Time Documented: Thigh (Right) - 104 minutes Total: Thigh (Right) - 104 minutes   DICTATION: .Other Dictation: Dictation Number [id number]  PLAN OF CARE: Admit to inpatient   PATIENT DISPOSITION:  PACU - hemodynamically stable.   Delay start of Pharmacological VTE agent (>24hrs) due to surgical blood loss or risk of bleeding: no

## 2018-04-22 NOTE — Plan of Care (Signed)
  Problem: Pain Managment: Goal: General experience of comfort will improve Outcome: Progressing   Problem: Safety: Goal: Ability to remain free from injury will improve Outcome: Progressing   

## 2018-04-22 NOTE — Anesthesia Postprocedure Evaluation (Signed)
Anesthesia Post Note  Patient: Frank Hudson  Procedure(s) Performed: RIGHT TOTAL KNEE ARTHROPLASTY (Right Knee)     Patient location during evaluation: PACU Anesthesia Type: Spinal Level of consciousness: awake Pain management: pain level controlled Vital Signs Assessment: post-procedure vital signs reviewed and stable Respiratory status: spontaneous breathing Cardiovascular status: stable Postop Assessment: no headache, no backache, spinal receding, patient able to bend at knees and no apparent nausea or vomiting Anesthetic complications: no    Last Vitals:  Vitals:   04/22/18 1310 04/22/18 1325  BP: 92/68 96/66  Pulse: 70 62  Resp: 13 12  Temp:    SpO2: 97% 98%    Last Pain:  Vitals:   04/22/18 1325  TempSrc:   PainSc: 0-No pain   Pain Goal:    LLE Motor Response: Purposeful movement;Responds to commands (04/22/18 1325) LLE Sensation: Numbness (04/22/18 1325) RLE Motor Response: Purposeful movement;Responds to commands (04/22/18 1325) RLE Sensation: Numbness (04/22/18 1325) L Sensory Level: S1-Sole of foot, small toes (04/22/18 1325) R Sensory Level: S1-Sole of foot, small toes (04/22/18 1325)  Huston Foley

## 2018-04-22 NOTE — Evaluation (Signed)
Physical Therapy Evaluation Patient Details Name: Frank Hudson MRN: [id number] DOB: 1950-07-16 Today's Date: 04/22/2018   History of Present Illness  Pt is a 67 y/o male s/p elective R TKA. Prior to surgery, found to have a fib, however, per notes is rate controlled and pt tolerated procedure well. PMH includes polymyalgia rheumatica.   Clinical Impression  Pt admitted secondary to problem above with deficits below. Gait limited to within the room secondary to pain. Required min guard A for mobility using RW this session. HR ranging from mid 70s to low 80s throughout. Educated about knee precautions and supine HEP. Will continue to follow acutely to maximize functional mobility independence and safety.     Follow Up Recommendations Follow surgeon's recommendation for DC plan and follow-up therapies;Supervision for mobility/OOB    Equipment Recommendations  None recommended by PT    Recommendations for Other Services       Precautions / Restrictions Precautions Precautions: Knee Precaution Booklet Issued: Yes (comment) Precaution Comments: Reviewed knee precautions with pt.  Required Braces or Orthoses: Knee Immobilizer - Right Knee Immobilizer - Right: Other (comment)(until discontinued) Restrictions Weight Bearing Restrictions: Yes RLE Weight Bearing: Weight bearing as tolerated      Mobility  Bed Mobility Overal bed mobility: Needs Assistance Bed Mobility: Supine to Sit     Supine to sit: Min assist     General bed mobility comments: Min A for RLE assist to come to EOB   Transfers Overall transfer level: Needs assistance Equipment used: Rolling walker (2 wheeled) Transfers: Sit to/from Stand Sit to Stand: Min guard         General transfer comment: Min guard for steadying assist. Cues for safe hand placement.   Ambulation/Gait Ambulation/Gait assistance: Min guard Gait Distance (Feet): 20 Feet Assistive device: Rolling walker (2 wheeled) Gait  Pattern/deviations: Step-to pattern;Decreased step length - right;Decreased step length - left;Decreased weight shift to right;Antalgic Gait velocity: Decreased    General Gait Details: Slow, moderately antalgic gait. Mild buckling noted, even with use of KI. Gait limited to within the room secondary to pain. Cues for sequencing using RW.   Stairs            Wheelchair Mobility    Modified Rankin (Stroke Patients Only)       Balance Overall balance assessment: Needs assistance Sitting-balance support: No upper extremity supported;Feet supported Sitting balance-Leahy Scale: Good     Standing balance support: Bilateral upper extremity supported;During functional activity Standing balance-Leahy Scale: Poor Standing balance comment: Reliant on BUE support.                              Pertinent Vitals/Pain Pain Assessment: Faces Faces Pain Scale: Hurts even more Pain Location: R knee  Pain Descriptors / Indicators: Aching;Operative site guarding;Grimacing;Guarding Pain Intervention(s): Limited activity within patient's tolerance;Monitored during session;Repositioned    Home Living Family/patient expects to be discharged to:: Private residence Living Arrangements: Spouse/significant other Available Help at Discharge: Family;Available 24 hours/day Type of Home: House Home Access: Level entry     Home Layout: Two level;Able to live on main level with bedroom/bathroom Home Equipment: Gilford Rile - 2 wheels;Bedside commode;Shower seat      Prior Function Level of Independence: Independent               Hand Dominance        Extremity/Trunk Assessment   Upper Extremity Assessment Upper Extremity Assessment: Overall WFL for tasks assessed  Lower Extremity Assessment Lower Extremity Assessment: RLE deficits/detail RLE Deficits / Details: Deficits consistent with post op pain and weakness.     Cervical / Trunk Assessment Cervical / Trunk  Assessment: Normal  Communication   Communication: No difficulties  Cognition Arousal/Alertness: Awake/alert Behavior During Therapy: WFL for tasks assessed/performed Overall Cognitive Status: Within Functional Limits for tasks assessed                                        General Comments General comments (skin integrity, edema, etc.): Pt's wife present during session.     Exercises Total Joint Exercises Ankle Circles/Pumps: AROM;Both;10 reps;Supine Quad Sets: AROM;Right;10 reps;Supine   Assessment/Plan    PT Assessment Patient needs continued PT services  PT Problem List Decreased strength;Decreased range of motion;Decreased balance;Decreased activity tolerance;Decreased mobility;Decreased knowledge of use of DME;Decreased knowledge of precautions;Pain       PT Treatment Interventions DME instruction;Gait training;Functional mobility training;Therapeutic activities;Therapeutic exercise;Balance training;Patient/family education    PT Goals (Current goals can be found in the Care Plan section)  Acute Rehab PT Goals Patient Stated Goal: to go home PT Goal Formulation: With patient Time For Goal Achievement: 05/06/18 Potential to Achieve Goals: Good    Frequency 7X/week   Barriers to discharge        Co-evaluation               AM-PAC PT "6 Clicks" Mobility  Outcome Measure Help needed turning from your back to your side while in a flat bed without using bedrails?: A Little Help needed moving from lying on your back to sitting on the side of a flat bed without using bedrails?: A Little Help needed moving to and from a bed to a chair (including a wheelchair)?: A Little Help needed standing up from a chair using your arms (e.g., wheelchair or bedside chair)?: A Little Help needed to walk in hospital room?: A Little Help needed climbing 3-5 steps with a railing? : A Lot 6 Click Score: 17    End of Session Equipment Utilized During Treatment: Gait  belt;Right knee immobilizer Activity Tolerance: Patient limited by pain Patient left: in chair;with call bell/phone within reach;with family/visitor present Nurse Communication: Mobility status PT Visit Diagnosis: Other abnormalities of gait and mobility (R26.89);Muscle weakness (generalized) (M62.81);Pain Pain - Right/Left: Right Pain - part of body: Knee    Time: 0737-1062 PT Time Calculation (min) (ACUTE ONLY): 26 min   Charges:   PT Evaluation $PT Eval Low Complexity: 1 Low PT Treatments $Therapeutic Activity: 8-22 mins        Leighton Ruff, PT, DPT  Acute Rehabilitation Services  Pager: [phone] Office: [phone]   Rudean Hitt 04/22/2018, 6:36 PM

## 2018-04-22 NOTE — Plan of Care (Signed)

## 2018-04-22 NOTE — Interval H&P Note (Signed)
History and Physical Interval Note:  04/22/2018 10:11 AM  Frank Hudson  has presented today for surgery, with the diagnosis of Right knee end stage osteoarthritis  The various methods of treatment have been discussed with the patient and family. After consideration of risks, benefits and other options for treatment, the patient has consented to  Procedure(s): TOTAL KNEE ARTHROPLASTY (Right) as a surgical intervention .  The patient's history has been reviewed, patient examined, no change in status, stable for surgery.  I have reviewed the patient's chart and labs.  Questions were answered to the patient's satisfaction.     Chaun Uemura,STEVEN R

## 2018-04-22 NOTE — Progress Notes (Signed)
Pt arrived to room 5N13 after surgery. Received report from [place], [place] in PACU. See assessment. Will continue to monitor.

## 2018-04-22 NOTE — Anesthesia Procedure Notes (Signed)
Procedure Name: MAC Date/Time: 04/22/2018 10:45 AM Performed by: Kyung Rudd, CRNA Pre-anesthesia Checklist: Patient identified, Emergency Drugs available, Suction available and Patient being monitored Patient Re-evaluated:Patient Re-evaluated prior to induction Oxygen Delivery Method: Simple face mask Induction Type: IV induction Placement Confirmation: positive ETCO2

## 2018-04-22 NOTE — Anesthesia Procedure Notes (Signed)
Spinal  Patient location during procedure: OR Start time: 04/22/2018 10:39 AM End time: 04/22/2018 10:43 AM Staffing Anesthesiologist: Lyn Hollingshead, MD Performed: anesthesiologist  Preanesthetic Checklist Completed: patient identified, site marked, surgical consent, pre-op evaluation, timeout performed, IV checked, risks and benefits discussed and monitors and equipment checked Spinal Block Patient position: sitting Prep: site prepped and draped and DuraPrep Patient monitoring: continuous pulse ox and blood pressure Approach: midline Location: L3-4 Injection technique: single-shot Needle Needle type: Pencan  Needle gauge: 24 G Needle length: 10 cm Needle insertion depth: 6 cm Assessment Sensory level: T8

## 2018-04-22 NOTE — Anesthesia Preprocedure Evaluation (Addendum)
Anesthesia Evaluation  Patient identified by MRN, date of birth, ID band Patient awake    Reviewed: Allergy & Precautions, NPO status , Patient's Chart, lab work & pertinent test results  Airway Mallampati: I       Dental no notable dental hx. (+) Teeth Intact, Dental Advisory Given   Pulmonary former smoker,    Pulmonary exam normal breath sounds clear to auscultation       Cardiovascular negative cardio ROS Normal cardiovascular exam Rhythm:Regular Rate:Normal     Neuro/Psych negative neurological ROS  negative psych ROS   GI/Hepatic negative GI ROS, Neg liver ROS,   Endo/Other  negative endocrine ROS  Renal/GU negative Renal ROS  negative genitourinary   Musculoskeletal   Abdominal (+) + obese,   Peds  Hematology negative hematology ROS (+)   Anesthesia Other Findings   Reproductive/Obstetrics                            Anesthesia Physical Anesthesia Plan  ASA: II  Anesthesia Plan: Spinal   Post-op Pain Management:  Regional for Post-op pain   Induction:   PONV Risk Score and Plan:   Airway Management Planned: Nasal Cannula, Natural Airway and Simple Face Mask  Additional Equipment:   Intra-op Plan:   Post-operative Plan:   Informed Consent: I have reviewed the patients History and Physical, chart, labs and discussed the procedure including the risks, benefits and alternatives for the proposed anesthesia with the patient or authorized representative who has indicated his/her understanding and acceptance.     Plan Discussed with: CRNA  Anesthesia Plan Comments:         Anesthesia Quick Evaluation

## 2018-04-22 NOTE — Transfer of Care (Signed)
Immediate Anesthesia Transfer of Care Note  Patient: Frank Hudson  Procedure(s) Performed: RIGHT TOTAL KNEE ARTHROPLASTY (Right Knee)  Patient Location: PACU  Anesthesia Type:Spinal  Level of Consciousness: awake, alert  and oriented  Airway & Oxygen Therapy: Patient Spontanous Breathing and Patient connected to nasal cannula oxygen  Post-op Assessment: Report given to RN, Post -op Vital signs reviewed and stable and Patient moving all extremities  Post vital signs: Reviewed and stable  Last Vitals:  Vitals Value Taken Time  BP 101/50 04/22/2018 12:55 PM  Temp    Pulse 64 04/22/2018 12:57 PM  Resp 14 04/22/2018 12:57 PM  SpO2 100 % 04/22/2018 12:57 PM  Vitals shown include unvalidated device data.  Last Pain:  Vitals:   04/22/18 0910  TempSrc:   PainSc: 0-No pain         Complications: No apparent anesthesia complications

## 2018-04-22 NOTE — Anesthesia Procedure Notes (Signed)
Anesthesia Regional Block: Adductor canal block   Pre-Anesthetic Checklist: ,, timeout performed, Correct Patient, Correct Site, Correct Laterality, Correct Procedure, Correct Position, site marked, Risks and benefits discussed,  Surgical consent,  Pre-op evaluation,  At surgeon's request and post-op pain management  Laterality: Lower and Right  Prep: chloraprep       Needles:  Injection technique: Single-shot  Needle Type: Echogenic Stimulator Needle     Needle Length: 10cm  Needle Gauge: 21   Needle insertion depth: 3 cm   Additional Needles:   Procedures:,,,, ultrasound used (permanent image in chart),,,,  Narrative:  Start time: 04/22/2018 8:50 AM End time: 04/22/2018 8:58 AM Injection made incrementally with aspirations every 5 mL.  Performed by: Personally  Anesthesiologist: Lyn Hollingshead, MD

## 2018-04-22 NOTE — Consult Note (Signed)
Cardiology Consultation:   Patient ID: Frank Hudson MRN: [id number]; DOB: 1951-01-29  Admit date: 04/22/2018 Date of Consult: 04/22/2018  Primary Care Provider: [name], MD Primary Cardiologist: New to [facility]  Patient Profile:   Frank Hudson is a 67 y.o. male with a hx of polymyalgia rheumatica and BPH who is being seen today for the evaluation of atrial fibrillation at the request of Dr. Veverly Hudson.   History of Present Illness:   Mr. Smoker admitted for scheduled outpatient right total knee arthroplasty. Incidently noted rate controlled atrial fibrillation/flutter on monitor, comfired by EKG. Underwent successful surgery. No complicates. Patient denies any prior hx of afib, CAD or stoke. He is very active. Does exercise 3 times/week without any limitations. Plays golf. The patient denies nausea, vomiting, fever, chest pain, palpitations, shortness of breath, orthopnea, PND, dizziness, syncope, cough, congestion, abdominal pain, hematochezia, melena, lower extremity edema. No hx of tobacco smoking. No family hx of CAD.   Past Medical History:  Diagnosis Date  . Arthritis   . BPH (benign prostatic hyperplasia)   . Osteoarthritis of right knee    End Stage  . PMR (polymyalgia rheumatica) (HCC)     Past Surgical History:  Procedure Laterality Date  . APPENDECTOMY    . ELBOW SURGERY Left 2015  . MENISCECTOMY Right 1978?    Inpatient Medications: Scheduled Meds: . aspirin  81 mg Oral BID  . docusate sodium  100 mg Oral BID  . [START ON 04/24/2018] dutasteride  0.5 mg Oral QODAY  . ferrous sulfate  325 mg Oral TID PC  . multivitamin   Oral Daily   Continuous Infusions: . sodium chloride 50 mL/hr at 04/22/18 1423  . clindamycin (CLEOCIN) IV    . methocarbamol (ROBAXIN) IV     PRN Meds: [START ON 04/23/2018] acetaminophen, bisacodyl, HYDROmorphone (DILAUDID) injection, menthol-cetylpyridinium **OR** phenol, methocarbamol **OR** methocarbamol (ROBAXIN) IV, metoCLOPramide  **OR** metoCLOPramide (REGLAN) injection, naproxen sodium, ondansetron **OR** ondansetron (ZOFRAN) IV, oxyCODONE, polyethylene glycol  Allergies:    Allergies  Allergen Reactions  . Amoxicillin Rash    Social History:   Social History   Socioeconomic History  . Marital status: Married    Spouse name: Not on file  . Number of children: Not on file  . Years of education: Not on file  . Highest education level: Not on file  Occupational History  . Not on file  Social Needs  . Financial resource strain: Not on file  . Food insecurity:    Worry: Not on file    Inability: Not on file  . Transportation needs:    Medical: Not on file    Non-medical: Not on file  Tobacco Use  . Smoking status: Former Smoker    Types: Cigarettes, Cigars    Last attempt to quit: 2004    Years since quitting: 15.9  . Smokeless tobacco: Never Used  Substance and Sexual Activity  . Alcohol use: Yes    Alcohol/week: 14.0 standard drinks    Types: 14 Standard drinks or equivalent per week  . Drug use: Not on file  . Sexual activity: Not on file  Lifestyle  . Physical activity:    Days per week: Not on file    Minutes per session: Not on file  . Stress: Not on file  Relationships  . Social connections:    Talks on phone: Not on file    Gets together: Not on file    Attends religious service: Not on file  Active member of club or organization: Not on file    Attends meetings of clubs or organizations: Not on file    Relationship status: Not on file  . Intimate partner violence:    Fear of current or ex partner: Not on file    Emotionally abused: Not on file    Physically abused: Not on file    Forced sexual activity: Not on file  Other Topics Concern  . Not on file  Social History Narrative  . Not on file    Family History:   Denies family hx of cardiovascular disease  ROS:  Please see the history of present illness.  All other ROS reviewed and negative.     Physical Exam/Data:    Vitals:   04/22/18 1325 04/22/18 1340 04/22/18 1355 04/22/18 1418  BP: 96/66 93/66 91/61  95/74  Pulse: 62 68 66 68  Resp: 12 (!) 9 13 15   Temp:   (!) 97.3 F (36.3 C) (!) 97.4 F (36.3 C)  TempSrc:    Oral  SpO2: 98% 96% 97% 99%  Weight:      Height:        Intake/Output Summary (Last 24 hours) at 04/22/2018 1453 Last data filed at 04/22/2018 1341 Gross per 24 hour  Intake 1000 ml  Output 675 ml  Net 325 ml   Filed Weights   04/22/18 0756  Weight: 91.6 kg   Body mass index is 30.71 kg/m.  General:  Well nourished, well developed, in no acute distress HEENT: normal Lymph: no adenopathy Neck: no JVD Endocrine:  No thryomegaly Vascular: No carotid bruits; FA pulses 2+ bilaterally without bruits  Cardiac:  normal S1, S2; irregular ; no murmur  Lungs:  clear to auscultation bilaterally, no wheezing, rhonchi or rales  Abd: soft, nontender, no hepatomegaly  Ext: no edema Musculoskeletal:  No deformities, BUE and BLE strength normal and equal Skin: warm and dry  Neuro:  CNs 2-12 intact, no focal abnormalities noted Psych:  Normal affect   EKG:  The EKG was personally reviewed and demonstrates:  Atrial fibrillation/flutter at rate of 69 bpm  Relevant CV Studies: None  Laboratory Data:  As above  Radiology/Studies:  Dg Knee Right Port  Result Date: 04/22/2018 CLINICAL DATA:  S/p RIGHT total knee replacement. EXAM: PORTABLE RIGHT KNEE - 1-2 VIEW COMPARISON:  None. FINDINGS: RIGHT total knee replacement identified without complicating features. Postoperative changes in the soft tissues are noted. No acute fracture or dislocation. IMPRESSION: .  RIGHT total knee replacement without complicating features. Electronically Signed   By: Margarette Canada M.D.   On: 04/22/2018 13:32    Assessment and Plan:   1. New onset atrial fibrillation/flutter - Controlled rate. Unknown chronicity. Incidental findings on pre-op EKG. Underwent successful R total  knee arthroplasty. Patient is  very active without any limitation. CHADSVACS score of 1 for age only. Get Echocardiogram. Start Eliquis 5mg  BID when okay with surgery. Plan DCCV in 4 weeks. Long term anticoagulation based on monitor eval post cardioversion.   2. S/p R total knee arthroplasty - Per primary team   For questions or updates, please contact Louise Please consult [email or web address] for contact info under     Jarrett Soho, PA  04/22/2018 2:53 PM

## 2018-04-22 NOTE — Progress Notes (Signed)
Orthopedic Tech Progress Note Patient Details:  Frank Hudson [facility] [birth year] [id number]      Post Interventions Patient Tolerated: Well Instructions Provided: Care of device   [name] 04/22/2018, 2:05 PM

## 2018-04-22 NOTE — Op Note (Signed)
NAME: Frank Hudson, Frank Hudson. MEDICAL RECORD [id number] ACCOUNT [id number] DATE OF BIRTH:1950-09-05 FACILITY: [facility] LOCATION: [facility] PHYSICIAN:STEVEN Orlena Sheldon, MD  OPERATIVE REPORT  DATE OF PROCEDURE:  04/22/2018  PREOPERATIVE DIAGNOSIS:  Right knee end-stage osteoarthritis.  POSTOPERATIVE DIAGNOSIS:  Right knee end-stage osteoarthritis.  PROCEDURE PERFORMED:  Right total knee replacement using DePuy Sigma rotating platform prosthesis.  ATTENDING SURGEON:  Esmond Plants, MD  ASSISTANT:  Darol Destine, [place], who was scrubbed during the entire procedure and necessary for satisfactory completion of surgery.  ANESTHESIA:  Spinal anesthesia was used plus adductor canal block.  ESTIMATED BLOOD LOSS:  Minimal.  FLUID REPLACEMENT:  1500 mL crystalloid.  INSTRUMENT COUNTS:  Correct.  COMPLICATIONS:  No complications.  ANTIBIOTICS:  Perioperative antibiotics were given.  TOURNIQUET TIME:  1 hour and 30 minutes at 300 mmHg.  INDICATIONS:  The patient is a 67 year old male with progressively worsening right knee pain secondary to end-stage arthritis.  The patient has bone-on-bone x-ray findings and has had progressive pain and functional loss due to primary osteoarthritis.   The patient has a significant varus malalignment and a flexion contracture.  The patient presents for operative treatment to restore function and eliminate pain to the knee.  Informed consent was obtained.  DESCRIPTION OF PROCEDURE:  After an adequate level of anesthesia was achieved, the patient was positioned in the supine position, right leg correctly identified, and a nonsterile tourniquet was placed on the proximal thigh, right leg sterilely prepped  and draped in the usual manner.  Timeout called.  We elevated the limb and exsanguinated using an Esmarch bandage, elevated the tourniquet to 300 mmHg, placed the knee in flexion and performed a midline incision using a 10-blade scalpel.  Dissection down  through  subcutaneous tissues using the scalpel blade.  We identified the median parapatellar tissues and performed a parapatellar arthrotomy with a fresh 10-blade scalpel.  We then everted the patella and divided the lateral patellofemoral ligaments.   We entered the distal femur with a step-cut drill.  We then placed our intramedullary resection guide and resected 11 mm off given the flexion contracture, the distal femur set on 5 degrees right.  We noted full-thickness cartilage loss in all 3  compartments.  Next, we sized our femur from anterior down and sized to a size 5 and performed anterior, posterior and chamfer cuts.  We then removed ACL and PCL meniscal tissues.  Exposure was difficult on the posteromedial aspect of the knee, but we  were able to get good exposure back there with releases off the tibia.  Next, we subluxed the tibia anteriorly, and we performed our tibial cut using external alignment jig 90 degrees perpendicular to the long axis of the tibia with minimal posterior  slope for this posterior cruciate substituting prosthesis.  We then made our cut with the oscillating saw.  We sized the tibia to a size 5 and did our remaining tibial preparation with the modular drill and keel punch after verifying that we had  symmetric gaps at 10 mm.  We also removed posterior osteophytes off the femur with a lamina spreader and 1-inch curved osteotome.  We then used our oscillating saw.  I did our box cut using the box resection guide for the 5 right femur.  We then placed  our trial 5 right femur in place and put a 10 mm poly spacer in and reduced the knee.  We felt like we could probably get a 12.5 as we had a little bit of  hyperextension.  We then substituted out for the 12.5 and got the knee to full extension.  It was  nice and stable, both in flexion and extension.  We then resurfaced the patella going from 27 mm thickness down to a 17 mm thickness and drilling the lug holes for the 38 patellar button.   Once we had the trial patella in place, we ranged the knee and  had excellent patellar tracking with no-touch technique.  We removed all trial components, pulse irrigated the knee, and then dried the bone well.  We then used vacuum mixed high viscosity cement and then cemented the components into place.  We placed a  12.5 poly spacer and placed the knee in extension.  We allowed the cement to harden.  We removed excess cement with a 1/4-inch curved osteotome, final inspection of the entire knee joint, and then placed the real 12.5, size 5 right tibia poly tray, and  reduced the knee.  We had a nice little pop in the medial side as we reduced the tibia onto the femoral condyle.  We ranged the knee again, stable both in flexion and extension.  Patellar tracking was perfect.  We irrigated thoroughly with pulse  irrigator and then repaired the parapatellar arthrotomy with #1 Vicryl suture interrupted followed by 2-0 Vicryl for subcutaneous closure and 4-0 Monocryl for skin.  Steri-Strips were applied followed by a sterile dressing.  The patient tolerated surgery  well.  LN/NUANCE  D:04/22/2018 T:04/22/2018 JOB:[id number]

## 2018-04-22 NOTE — Care Management Note (Signed)
Case Management Note  Patient Details  Name: Frank Hudson MRN: [id number] Date of Birth: [birth year]  Subjective/Objective:   67 yr old gentleman s/p right total knee arthroplasty.                 Action/Plan:Case manager spoke with patient and wife concerning discharge plan and DME. Patient was preoperatively setup with [facility], no changes. They have borrowed DME. Patient will have family support at discharge.   Expected Discharge Date:   04/23/18               Expected Discharge Plan:  [place]  In-House Referral:  NA  Discharge planning Services  CM Consult  Post Acute Care Choice:  Home Health Choice offered to:  Patient, Spouse  DME Arranged:  (has RW and 3in1) DME Agency:  NA  HH Arranged:  PT [place] Agency:  [facility] (formerly [employer])  Status of Service:  Completed, signed off  If discussed at [employer], dates discussed:    Additional Comments:  Ninfa Meeker, RN 04/22/2018, 4:01 PM

## 2018-04-23 DIAGNOSIS — I4819 Other persistent atrial fibrillation: Secondary | ICD-10-CM

## 2018-04-23 LAB — BASIC METABOLIC PANEL
Anion gap: 11 (ref 5–15)
BUN: 13 mg/dL (ref 8–23)
CO2: 25 mmol/L (ref 22–32)
Calcium: 8.7 mg/dL — ABNORMAL LOW (ref 8.9–10.3)
Chloride: 98 mmol/L (ref 98–111)
Creatinine, Ser: 1.22 mg/dL (ref 0.61–1.24)
GFR calc Af Amer: >60 mL/min (ref >60)
GFR calc non Af Amer: >60 mL/min (ref >60)
Glucose, Bld: 175 mg/dL — ABNORMAL HIGH (ref 70–99)
POTASSIUM: 4.1 mmol/L (ref 3.5–5.1)
Sodium: 134 mmol/L — ABNORMAL LOW (ref 135–145)

## 2018-04-23 LAB — CBC
HCT: 38.5 % — ABNORMAL LOW (ref 39.0–52.0)
Hemoglobin: 12.9 g/dL — ABNORMAL LOW (ref 13.0–17.0)
MCH: 31.5 pg (ref 26.0–34.0)
MCHC: 33.5 g/dL (ref 30.0–36.0)
MCV: 93.9 fL (ref 80.0–100.0)
Platelets: 176 10*3/uL (ref 150–400)
RBC: 4.1 MIL/uL — ABNORMAL LOW (ref 4.22–5.81)
RDW: 12.7 % (ref 11.5–15.5)
WBC: 10.4 10*3/uL (ref 4.0–10.5)
nRBC: 0 % (ref 0.0–0.2)

## 2018-04-23 MED ORDER — PROMETHAZINE HCL 25 MG/ML IJ SOLN
6.2500 mg | Freq: Four times a day (QID) | INTRAMUSCULAR | Status: DC | PRN
  Administered 2018-04-23: 12.5 mg via INTRAVENOUS
  Filled 2018-04-23: qty 1

## 2018-04-23 MED ORDER — PROMETHAZINE HCL 25 MG PO TABS: 12.5000 mg | ORAL_TABLET | Freq: Four times a day (QID) | ORAL | Status: DC | PRN

## 2018-04-23 NOTE — Progress Notes (Signed)
Physical Therapy Treatment Patient Details Name: Frank Hudson MRN: [id number] DOB: 30-Aug-1950 Today's Date: 04/23/2018    History of Present Illness Pt is a 67 y/o male s/p elective R TKA. Prior to surgery, found to have a fib, however, per notes is rate controlled and pt tolerated procedure well. PMH includes polymyalgia rheumatica.     PT Comments    Pt performed LE exercises and gait progression this am.  Pt reports nausea but no emesis noted.  Gave patient saltines and gingerale during session.  Plan for follow up this pm.  AROM of R knee 20-90 degrees.  Plan for return home remains appropriate with support from spouse.      Follow Up Recommendations  Follow surgeon's recommendation for DC plan and follow-up therapies;Supervision for mobility/OOB     Equipment Recommendations  None recommended by PT    Recommendations for Other Services       Precautions / Restrictions Precautions Precautions: Knee Precaution Booklet Issued: Yes (comment) Precaution Comments: Reviewed knee precautions with pt.  Restrictions Weight Bearing Restrictions: Yes RLE Weight Bearing: Weight bearing as tolerated    Mobility  Bed Mobility Overal bed mobility: Needs Assistance Bed Mobility: Supine to Sit     Supine to sit: Supervision     General bed mobility comments: Increased time and effort to advance to edge of bed.    Transfers Overall transfer level: Needs assistance Equipment used: Rolling walker (2 wheeled) Transfers: Sit to/from Stand Sit to Stand: Min guard         General transfer comment: Cues for hand placement to and from seated surface.  Cues for forward advancement of RLE, decreased R knee extension in standing.    Ambulation/Gait Ambulation/Gait assistance: Min guard Gait Distance (Feet): 100 Feet Assistive device: Rolling walker (2 wheeled) Gait Pattern/deviations: Step-to pattern;Decreased step length - right;Decreased step length - left;Decreased weight  shift to right;Antalgic;Step-through pattern;Trunk flexed     General Gait Details: Cues for heel strike on R to improve knee extension in stance phase.  Pt slow and guarded with decreased knee extension.  Cues for forward gaze.     Stairs             Wheelchair Mobility    Modified Rankin (Stroke Patients Only)       Balance Overall balance assessment: Needs assistance   Sitting balance-Leahy Scale: Good       Standing balance-Leahy Scale: Fair                              Cognition Arousal/Alertness: Awake/alert Behavior During Therapy: WFL for tasks assessed/performed Overall Cognitive Status: Within Functional Limits for tasks assessed                                        Exercises Total Joint Exercises Ankle Circles/Pumps: AROM;Both;Supine;20 reps Quad Sets: AROM;Right;10 reps;Supine Heel Slides: AAROM;Right;10 reps;Supine Hip ABduction/ADduction: AAROM;Right;10 reps;Supine Straight Leg Raises: AAROM;Right;10 reps;Supine    General Comments        Pertinent Vitals/Pain Pain Assessment: 0-10 Faces Pain Scale: Hurts little more Pain Location: R knee  Pain Descriptors / Indicators: Aching;Operative site guarding;Grimacing;Guarding Pain Intervention(s): Monitored during session;Repositioned;Ice applied    Home Living                      Prior Function  PT Goals (current goals can now be found in the care plan section) Acute Rehab PT Goals Patient Stated Goal: to go home Potential to Achieve Goals: Good Progress towards PT goals: Progressing toward goals    Frequency    7X/week      PT Plan Current plan remains appropriate    Co-evaluation              AM-PAC PT "6 Clicks" Mobility   Outcome Measure  Help needed turning from your back to your side while in a flat bed without using bedrails?: A Little Help needed moving from lying on your back to sitting on the side of a flat  bed without using bedrails?: A Little Help needed moving to and from a bed to a chair (including a wheelchair)?: A Little Help needed standing up from a chair using your arms (e.g., wheelchair or bedside chair)?: A Little Help needed to walk in hospital room?: A Little Help needed climbing 3-5 steps with a railing? : A Lot 6 Click Score: 17    End of Session Equipment Utilized During Treatment: Gait belt Activity Tolerance: Patient limited by pain Patient left: in chair;with call bell/phone within reach;with family/visitor present Nurse Communication: Mobility status PT Visit Diagnosis: Other abnormalities of gait and mobility (R26.89);Muscle weakness (generalized) (M62.81);Pain Pain - Right/Left: Right Pain - part of body: Knee     Time: 5374-8270 PT Time Calculation (min) (ACUTE ONLY): 28 min  Charges:  $Gait Training: 8-22 mins $Therapeutic Exercise: 8-22 mins                     Governor Rooks, PTA Acute Rehabilitation Services Pager [phone] Office [phone]     Ernisha Sorn Eli Hose 04/23/2018, 11:21 AM

## 2018-04-23 NOTE — Progress Notes (Signed)
   Progress Note   Subjective   Doing well today, the patient denies CP or SOB.  No new concerns  Inpatient Medications    Scheduled Meds: . apixaban  5 mg Oral BID  . aspirin  81 mg Oral BID  . docusate sodium  100 mg Oral BID  . [START ON 04/24/2018] dutasteride  0.5 mg Oral QODAY  . ferrous sulfate  325 mg Oral TID PC  . multivitamin   Oral Daily   Continuous Infusions: . sodium chloride 50 mL/hr at 04/23/18 0836  . methocarbamol (ROBAXIN) IV     PRN Meds: acetaminophen, bisacodyl, HYDROmorphone (DILAUDID) injection, menthol-cetylpyridinium **OR** phenol, methocarbamol **OR** methocarbamol (ROBAXIN) IV, metoCLOPramide **OR** metoCLOPramide (REGLAN) injection, naproxen sodium, ondansetron **OR** ondansetron (ZOFRAN) IV, oxyCODONE, polyethylene glycol, promethazine **OR** promethazine   Vital Signs    Vitals:   04/23/18 0032 04/23/18 0400 04/23/18 0831 04/23/18 0832  BP: 118/79 114/68 94/83 (!) 100/56  Pulse: 83 78 70 79  Resp: 18 19 16    Temp: 98.8 F (37.1 C) 98.4 F (36.9 C) 98.1 F (36.7 C)   TempSrc: Oral Oral Oral   SpO2: 100% 99% 95% 99%  Weight:      Height:        Intake/Output Summary (Last 24 hours) at 04/23/2018 1123 Last data filed at 04/23/2018 0900 Gross per 24 hour  Intake 2443.09 ml  Output 1600 ml  Net 843.09 ml   Filed Weights   04/22/18 0756  Weight: 91.6 kg    Telemetry    Pt is not on telemetry  Physical Exam   GEN- The patient is well appearing, alert and oriented x 3 today.   Head- normocephalic, atraumatic Eyes-  Sclera clear, conjunctiva pink Ears- hearing intact Oropharynx- clear Neck- supple, Lungs- Clear to ausculation bilaterally, normal work of breathing Heart- irregular rate and rhythm  GI- soft, NT, ND, + BS Extremities- no clubbing, cyanosis, or edema  MS-  R knee dressed Skin- no rash or lesion Psych- euthymic mood, full affect Neuro- strength and sensation are intact   Labs    Chemistry Recent Labs  Lab  04/23/18 0551  NA 134*  K 4.1  CL 98  CO2 25  GLUCOSE 175*  BUN 13  CREATININE 1.22  CALCIUM 8.7*  GFRNONAA >60  GFRAA >60  ANIONGAP 11     Hematology Recent Labs  Lab 04/23/18 0551  WBC 10.4  RBC 4.10*  HGB 12.9*  HCT 38.5*  MCV 93.9  MCH 31.5  MCHC 33.5  RDW 12.7  PLT 176    Cardiac EnzymesNo results for input(s): TROPONINI in the last 168 hours. No results for input(s): TROPIPOC in the last 168 hours.      Assessment & Plan    1.  New onset afib Asymptomatic Rate controlled chads2vasc score is 1 He has been started on eliquis. I would advise that ASA be discontinued to avoid excessive bleeding risks in combination with anticoagulant but will defer to ortho  As per Dr [name], the patient will be scheduled to follow-up with Dr [name] in 3 weeks. Ok to discharge from cardiology standpoint  Cardiology to see as needed while here.  Please call with questions.  Thompson Grayer MD, [facility] 04/23/2018 11:23 AM

## 2018-04-23 NOTE — Progress Notes (Signed)
Physical Therapy Treatment Patient Details Name: Frank Hudson MRN: [id number] DOB: Mar 27, 1951 Today's Date: 04/23/2018    History of Present Illness Pt is a 67 y/o male s/p elective R TKA. Prior to surgery, found to have a fib, however, per notes is rate controlled and pt tolerated procedure well. PMH includes polymyalgia rheumatica.     PT Comments    Pt standing in room on arrival and groggy from anti nausea meds.  Gt not safe at this time and deferred tx to chair level exercises to improve strength and ROM to promote functional independence.     Follow Up Recommendations  Follow surgeon's recommendation for DC plan and follow-up therapies;Supervision for mobility/OOB     Equipment Recommendations  None recommended by PT    Recommendations for Other Services       Precautions / Restrictions Precautions Precautions: Knee Precaution Booklet Issued: Yes (comment) Precaution Comments: Reviewed knee precautions with pt.  Required Braces or Orthoses: Knee Immobilizer - Right Knee Immobilizer - Right: Other (comment)(until discontinued.  ) Restrictions Weight Bearing Restrictions: Yes RLE Weight Bearing: Weight bearing as tolerated    Mobility  Bed Mobility                  Transfers                 General transfer comment: Pt standing in room with nursing on arrival assisted patient to chair with +2 min assistance.  pt groggy from anti nausea meds ordered earlier.    Ambulation/Gait                 Stairs             Wheelchair Mobility    Modified Rankin (Stroke Patients Only)       Balance                                            Cognition Arousal/Alertness: Awake/alert Behavior During Therapy: WFL for tasks assessed/performed Overall Cognitive Status: Within Functional Limits for tasks assessed                                        Exercises Total Joint Exercises Ankle Circles/Pumps:  AROM;Both;Supine;20 reps Quad Sets: AROM;Right;10 reps;Supine Heel Slides: AAROM;Right;10 reps;Supine Hip ABduction/ADduction: AAROM;Right;10 reps;Supine Straight Leg Raises: AAROM;Right;10 reps;Supine Long Arc Quad: AROM;Right;10 reps;Seated    General Comments        Pertinent Vitals/Pain Pain Assessment: Faces Faces Pain Scale: Hurts little more Pain Location: R knee  Pain Descriptors / Indicators: Aching;Operative site guarding;Grimacing;Guarding Pain Intervention(s): Monitored during session;Repositioned    Home Living                      Prior Function            PT Goals (current goals can now be found in the care plan section) Acute Rehab PT Goals Patient Stated Goal: to go home Potential to Achieve Goals: Good Progress towards PT goals: Progressing toward goals    Frequency    7X/week      PT Plan Current plan remains appropriate    Co-evaluation              AM-PAC PT "6 Clicks" Mobility   Outcome  Measure  Help needed turning from your back to your side while in a flat bed without using bedrails?: A Little Help needed moving from lying on your back to sitting on the side of a flat bed without using bedrails?: A Little Help needed moving to and from a bed to a chair (including a wheelchair)?: A Little Help needed standing up from a chair using your arms (e.g., wheelchair or bedside chair)?: A Little Help needed to walk in hospital room?: A Little Help needed climbing 3-5 steps with a railing? : A Lot 6 Click Score: 17    End of Session Equipment Utilized During Treatment: Gait belt Activity Tolerance: Patient limited by pain Patient left: in chair;with call bell/phone within reach;with family/visitor present Nurse Communication: Mobility status PT Visit Diagnosis: Other abnormalities of gait and mobility (R26.89);Muscle weakness (generalized) (M62.81);Pain Pain - Right/Left: Right Pain - part of body: Knee     Time:  8184-0375 PT Time Calculation (min) (ACUTE ONLY): 16 min  Charges:  $Therapeutic Exercise: 8-22 mins                     Governor Rooks, PTA Acute Rehabilitation Services Pager [phone] Office [phone]     Zia Najera Eli Hose 04/23/2018, 4:29 PM

## 2018-04-23 NOTE — Progress Notes (Signed)
Subjective: 1 Day Post-Op Procedure(s) (LRB): RIGHT TOTAL KNEE ARTHROPLASTY (Right) Patient reports pain as moderate. C/o nausea despite zofran. No other c/o. Denies CP, SOB, fever, chills, N/V, numbness, tingling.  Objective: Vital signs in last 24 hours: Temp:  [97.3 F (36.3 C)-98.8 F (37.1 C)] 98.4 F (36.9 C) (12/07 0400) Pulse Rate:  [62-83] 78 (12/07 0400) Resp:  [9-19] 19 (12/07 0400) BP: (91-140)/(50-91) 114/68 (12/07 0400) SpO2:  [96 %-100 %] 99 % (12/07 0400)  Intake/Output from previous day: 12/06 0701 - 12/07 0700 In: 1360 [P.O.:360; I.V.:1000] Out: 1725 [Urine:1675; Blood:50] Intake/Output this shift: No intake/output data recorded.  Recent Labs    04/23/18 0551  HGB 12.9*   Recent Labs    04/23/18 0551  WBC 10.4  RBC 4.10*  HCT 38.5*  PLT 176   Recent Labs    04/23/18 0551  NA 134*  K 4.1  CL 98  CO2 25  BUN 13  CREATININE 1.22  GLUCOSE 175*  CALCIUM 8.7*   No results for input(s): LABPT, INR in the last 72 hours.  Neurologically intact ABD soft Neurovascular intact Sensation intact distally Intact pulses distally Dorsiflexion/Plantar flexion intact Incision: dressing C/D/I and no drainage No cellulitis present Compartment soft no calf pain or sign of DVT  Anticipated LOS equal to or greater than 2 midnights due to - Age 69 and older with one or more of the following:  - Obesity  - Expected need for hospital services (PT, OT, Nursing) required for safe  discharge  - Anticipated need for postoperative skilled nursing care or inpatient rehab  - Active co-morbidities: None   Assessment/Plan: 1 Day Post-Op Procedure(s) (LRB): RIGHT TOTAL KNEE ARTHROPLASTY (Right) Advance diet Up with therapy D/C IV fluids Will add phenergan for refractory nausea Plan dressing change tomorrow Possible D/C tomorrow vs Monday depending on pain and progress with PT  BISSELL, JACLYN M. 04/23/2018, 8:24 AM

## 2018-04-24 DIAGNOSIS — I4819 Other persistent atrial fibrillation: Secondary | ICD-10-CM

## 2018-04-24 LAB — CBC
HCT: 36 % — ABNORMAL LOW (ref 39.0–52.0)
Hemoglobin: 11.7 g/dL — ABNORMAL LOW (ref 13.0–17.0)
MCH: 30.3 pg (ref 26.0–34.0)
MCHC: 32.5 g/dL (ref 30.0–36.0)
MCV: 93.3 fL (ref 80.0–100.0)
NRBC: 0 % (ref 0.0–0.2)
Platelets: 167 10*3/uL (ref 150–400)
RBC: 3.86 MIL/uL — ABNORMAL LOW (ref 4.22–5.81)
RDW: 12.3 % (ref 11.5–15.5)
WBC: 12.5 10*3/uL — ABNORMAL HIGH (ref 4.0–10.5)

## 2018-04-24 MED ORDER — ZOLPIDEM TARTRATE 5 MG PO TABS
5.0000 mg | ORAL_TABLET | Freq: Once | ORAL | Status: AC
  Administered 2018-04-24: 5 mg via ORAL
  Filled 2018-04-24: qty 1

## 2018-04-24 MED ORDER — APIXABAN 5 MG PO TABS: 5.0000 mg | ORAL_TABLET | Freq: Two times a day (BID) | ORAL | 0 refills | Status: DC

## 2018-04-24 NOTE — Discharge Instructions (Addendum)
Ice to the knee constantly.  Keep the incision covered and clean and dry for one week, then ok to get it wet in the shower. Ok to place full weight on the right leg.   Do exercise as instructed several times per day. Wear knee immobilizer at night to keep knee straight  DO NOT prop anything behind the knee, it will make your knee stiff.   Use the walker for balance and support until follow up appt.  Take Baby Aspirin twice per day and wear stockings on both legs for 30 days to prevent blood clots  Follow up with Dr Veverly Fells in two weeks in the office, call [phone] for appt  ==============================================================================  Information on my medicine - ELIQUIS (apixaban)  Why was Eliquis prescribed for you? Eliquis was prescribed for you to reduce the risk of a blood clot forming that can cause a stroke if you have a medical condition called atrial fibrillation (a type of irregular heartbeat).  What do You need to know about Eliquis ? Take your Eliquis TWICE DAILY - one tablet in the morning and one tablet in the evening with or without food. If you have difficulty swallowing the tablet whole please discuss with your pharmacist how to take the medication safely.  Take Eliquis exactly as prescribed by your doctor and DO NOT stop taking Eliquis without talking to the doctor who prescribed the medication.  Stopping may increase your risk of developing a stroke.  Refill your prescription before you run out.  After discharge, you should have regular check-up appointments with your healthcare provider that is prescribing your Eliquis.  In the future your dose may need to be changed if your kidney function or weight changes by a significant amount or as you get older.  What do you do if you miss a dose? If you miss a dose, take it as soon as you remember on the same day and resume taking twice daily.  Do not take more than one dose of ELIQUIS at the same  time to make up a missed dose.  Important Safety Information A possible side effect of Eliquis is bleeding. You should call your healthcare provider right away if you experience any of the following: ? Bleeding from an injury or your nose that does not stop. ? Unusual colored urine (red or dark brown) or unusual colored stools (red or black). ? Unusual bruising for unknown reasons. ? A serious fall or if you hit your head (even if there is no bleeding).  Some medicines may interact with Eliquis and might increase your risk of bleeding or clotting while on Eliquis. To help avoid this, consult your healthcare provider or pharmacist prior to using any new prescription or non-prescription medications, including herbals, vitamins, non-steroidal anti-inflammatory drugs (NSAIDs) and supplements.  This website has more information on Eliquis (apixaban): http://[email or web address].com/eliquis/home

## 2018-04-24 NOTE — Discharge Summary (Signed)
Orthopedic Discharge Summary        Physician Discharge Summary  Patient ID: Frank Hudson MRN: [id number] DOB/AGE: 1950-10-12 67 y.o.  Admit date: 04/22/2018 Discharge date: 04/24/2018   Procedures:  Procedure(s) (LRB): RIGHT TOTAL KNEE ARTHROPLASTY (Right)  Attending Physician:  Dr. Esmond Plants  Admission Diagnoses:   Right knee end staged primary OA  Discharge Diagnoses:  Right knee end staged primary OA, atrial fibrillation   Past Medical History:  Diagnosis Date  . Arthritis   . BPH (benign prostatic hyperplasia)   . Osteoarthritis of right knee    End Stage  . PMR (polymyalgia rheumatica) (HCC)     PCP: [name], MD   Discharged Condition: good  Hospital Course:  Patient noted to have asymptomatic A-Fib upon admission to the hospital. He underwent the above stated procedure on 04/22/2018.  Patient tolerated the procedure well and brought to the recovery room in good condition. Cardiology consulted in PACU and recommended treatment with Eliquis 5 mg BID.   Patient had an uncomplicated hospital course and was stable for discharge.   Disposition: Discharge disposition: 01-Home or Self Care      with orthopedic follow up in 2 weeks and cardiology follow up per [facility]   Follow-up Information    [name], MD. Call in 2 weeks.   Specialty:  Orthopedic Surgery Why:  [phone] Contact information: [address] [place] [address] [phone]        Home, [facility] Follow up.   Specialty:  [facility] Why:  A representative from [facility] will contact you to arrange start date and time for your therapy. Contact information: [address] [place]  [address] [phone]           Discharge Instructions    Call MD / Call 911   Complete by:  As directed    If you experience chest pain or shortness of breath, CALL 911 and be transported to the hospital emergency room.  If you  develope a fever above 101 F, pus (white drainage) or increased drainage or redness at the wound, or calf pain, call your surgeon's office.   Call MD / Call 911   Complete by:  As directed    If you experience chest pain or shortness of breath, CALL 911 and be transported to the hospital emergency room.  If you develope a fever above 101 F, pus (white drainage) or increased drainage or redness at the wound, or calf pain, call your surgeon's office.   Constipation Prevention   Complete by:  As directed    Drink plenty of fluids.  Prune juice may be helpful.  You may use a stool softener, such as Colace (over the counter) 100 mg twice a day.  Use MiraLax (over the counter) for constipation as needed.   Constipation Prevention   Complete by:  As directed    Drink plenty of fluids.  Prune juice may be helpful.  You may use a stool softener, such as Colace (over the counter) 100 mg twice a day.  Use MiraLax (over the counter) for constipation as needed.   Diet - low sodium heart healthy   Complete by:  As directed    Diet - low sodium heart healthy   Complete by:  As directed    Driving restrictions   Complete by:  As directed    No driving for 3 weeks   Increase activity slowly as  tolerated   Complete by:  As directed    Increase activity slowly as tolerated   Complete by:  As directed    Weight bearing as tolerated   Complete by:  As directed    Laterality:  right   Extremity:  Lower      Allergies as of 04/24/2018      Reactions   Amoxicillin Rash      Medication List    STOP taking these medications   ibuprofen 200 MG tablet Commonly known as:  ADVIL,MOTRIN     TAKE these medications   apixaban 5 MG Tabs tablet Commonly known as:  ELIQUIS Take 1 tablet (5 mg total) by mouth 2 (two) times daily.   aspirin 81 MG chewable tablet Chew 1 tablet (81 mg total) by mouth 2 (two) times daily.   dutasteride 0.5 MG capsule Commonly known as:  AVODART Take 0.5 mg by mouth every  other day.   methocarbamol 500 MG tablet Commonly known as:  ROBAXIN Take 1 tablet (500 mg total) by mouth 3 (three) times daily as needed.   MULTIVITAMIN PO Take 1 tablet by mouth daily.   naproxen sodium 220 MG tablet Commonly known as:  ALEVE Take 220 mg by mouth daily as needed (for pain or headache).   oxyCODONE-acetaminophen 5-325 MG tablet Commonly known as:  PERCOCET/ROXICET Take 1 tablet by mouth every 4 (four) hours as needed for severe pain.            Durable Medical Equipment  (From admission, onward)         Start     Ordered   04/22/18 1416  DME Walker rolling  Once    Question:  Patient needs a walker to treat with the following condition  Answer:  Status post total knee replacement, right   04/22/18 1415   04/22/18 1416  DME 3 n 1  Once     04/22/18 1415           Discharge Care Instructions  (From admission, onward)         Start     Ordered   04/24/18 0000  Weight bearing as tolerated    Question Answer Comment  Laterality right   Extremity Lower      04/24/18 0942            Signed: Augustin Schooling 04/24/2018, 11:31 AM  [place] is now [employer] [address] [place], [place], [place] [address] Phone: [place]

## 2018-04-24 NOTE — Progress Notes (Signed)
Physical Therapy Treatment Patient Details Name: Frank Hudson MRN: [id number] DOB: 10/04/1950 Today's Date: 04/24/2018    History of Present Illness Pt is a 67 y/o male s/p elective R TKA. Prior to surgery, found to have a fib, however, per notes is rate controlled and pt tolerated procedure well. PMH includes polymyalgia rheumatica.     PT Comments    Patient seen for mobility progression. Pt in chair upon arrival with knees bent. Precautions/positioning reviewed with pt and educated on important of positioning to achieving optimal knee ROM. Pt is making progress toward PT goals and tolerated increased gait distance. Pt requires assistance for balance initially when standing this session due to posterior bias.  Pt had posterior LOB X 2 with initial stand but otherwise requires supervision/min guard for gait training. Continue to progress as tolerated.   Follow Up Recommendations  Follow surgeon's recommendation for DC plan and follow-up therapies;Supervision for mobility/OOB     Equipment Recommendations  None recommended by PT    Recommendations for Other Services       Precautions / Restrictions Precautions Precautions: Knee Precaution Comments: Reviewed knee precautions/positioning reviewed with pt  Required Braces or Orthoses: Knee Immobilizer - Right Knee Immobilizer - Right: Other (comment)(not used this session) Restrictions Weight Bearing Restrictions: Yes RLE Weight Bearing: Weight bearing as tolerated    Mobility  Bed Mobility               General bed mobility comments: pt OOB in chair upon arrival   Transfers Overall transfer level: Needs assistance Equipment used: Rolling walker (2 wheeled) Transfers: Sit to/from Stand Sit to Stand: Min assist;Max assist         General transfer comment: min A to stand and max A required upon standing due to posterior bias and LOB X 2; cues for safe hand placement  Ambulation/Gait Ambulation/Gait assistance:  Min guard Gait Distance (Feet): 150 Feet Assistive device: Rolling walker (2 wheeled) Gait Pattern/deviations: Step-through pattern;Decreased stance time - right;Decreased step length - left;Decreased weight shift to right;Decreased dorsiflexion - right;Antalgic     General Gait Details: heavy use of bilat UE for support; cues for safe proximity to RW, posture, sequencing, and R heel strike; tactile cues for R quad activation during stance phase   Stairs             Wheelchair Mobility    Modified Rankin (Stroke Patients Only)       Balance Overall balance assessment: Needs assistance Sitting-balance support: Feet supported Sitting balance-Leahy Scale: Good     Standing balance support: Bilateral upper extremity supported Standing balance-Leahy Scale: Poor Standing balance comment: Reliant on BUE support.                             Cognition Arousal/Alertness: Awake/alert Behavior During Therapy: WFL for tasks assessed/performed Overall Cognitive Status: Within Functional Limits for tasks assessed                                        Exercises Total Joint Exercises Quad Sets: AROM;Right;10 reps    General Comments        Pertinent Vitals/Pain Pain Assessment: 0-10 Pain Score: 6  Pain Location: R knee  Pain Descriptors / Indicators: Guarding;Sore Pain Intervention(s): Limited activity within patient's tolerance;Monitored during session;Repositioned;Other (comment)(pt reports pain meds make him sick/did not have this am)  Home Living                      Prior Function            PT Goals (current goals can now be found in the care plan section) Progress towards PT goals: Progressing toward goals    Frequency    7X/week      PT Plan Current plan remains appropriate    Co-evaluation              AM-PAC PT "6 Clicks" Mobility   Outcome Measure  Help needed turning from your back to your side  while in a flat bed without using bedrails?: A Little Help needed moving from lying on your back to sitting on the side of a flat bed without using bedrails?: A Little Help needed moving to and from a bed to a chair (including a wheelchair)?: A Little Help needed standing up from a chair using your arms (e.g., wheelchair or bedside chair)?: A Little Help needed to walk in hospital room?: A Little Help needed climbing 3-5 steps with a railing? : A Lot 6 Click Score: 17    End of Session Equipment Utilized During Treatment: Gait belt Activity Tolerance: Patient tolerated treatment well Patient left: in chair;with call bell/phone within reach;Other (comment)(R LE in zero degree foam) Nurse Communication: Mobility status PT Visit Diagnosis: Other abnormalities of gait and mobility (R26.89);Muscle weakness (generalized) (M62.81);Pain Pain - Right/Left: Right Pain - part of body: Knee     Time: 0921-0949 PT Time Calculation (min) (ACUTE ONLY): 28 min  Charges:  $Gait Training: 8-22 mins $Therapeutic Activity: 8-22 mins                     Earney Navy, PTA Acute Rehabilitation Services Pager: [phone] Office: [phone]     Darliss Cheney 04/24/2018, 10:30 AM

## 2018-04-24 NOTE — Progress Notes (Signed)
Subjective: 2 Days Post-Op Procedure(s) (LRB): RIGHT TOTAL KNEE ARTHROPLASTY (Right) Patient reports pain as moderate.  Improved with pain meds.  No n/v/f/c.  Doing well with PT.  Wants to go home.  Objective: Vital signs in last 24 hours: Temp:  [98.8 F (37.1 C)-100.1 F (37.8 C)] 98.8 F (37.1 C) (12/08 0422) Pulse Rate:  [80-93] 80 (12/08 0422) Resp:  [16] 16 (12/07 1100) BP: (123-141)/(70-76) 141/76 (12/08 0422) SpO2:  [96 %-100 %] 99 % (12/08 0422)  Intake/Output from previous day: 12/07 0701 - 12/08 0700 In: 6378 [P.O.:120; I.V.:1186; IV Piggyback:150] Out: 1100 [Urine:1100] Intake/Output this shift: No intake/output data recorded.  Recent Labs    04/23/18 0551 04/24/18 0238  HGB 12.9* 11.7*   Recent Labs    04/23/18 0551 04/24/18 0238  WBC 10.4 12.5*  RBC 4.10* 3.86*  HCT 38.5* 36.0*  PLT 176 167   Recent Labs    04/23/18 0551  NA 134*  K 4.1  CL 98  CO2 25  BUN 13  CREATININE 1.22  GLUCOSE 175*  CALCIUM 8.7*   No results for input(s): LABPT, INR in the last 72 hours.  PE:  incision healing well.  Moderate swelling.  No signs of infection.  Anticipated LOS equal to or greater than 2 midnights due to - Age 69 and older with one or more of the following:  - Obesity  - Expected need for hospital services (PT, OT, Nursing) required for safe  discharge  - Anticipated need for postoperative skilled nursing care or inpatient rehab  - Active co-morbidities: Chronic pain requiring opiods OR   - Unanticipated findings during/Post Surgery: None  - Patient is a high risk of re-admission due to: None   Assessment/Plan: 2 Days Post-Op Procedure(s) (LRB): RIGHT TOTAL KNEE ARTHROPLASTY (Right) Discharge home with home health  Dressing changed today with Aquacel    Wylene Simmer 04/24/2018, 9:38 AM

## 2018-04-24 NOTE — Progress Notes (Signed)
Prentiss Bells, RN gave pt discharge instructions. Pt and wife stated understanding, gave pt 4 prescriptions. Wife packed all belongings, IV has been removed.

## 2018-04-26 DIAGNOSIS — Z87891 Personal history of nicotine dependence: Secondary | ICD-10-CM | POA: Diagnosis not present

## 2018-04-26 DIAGNOSIS — Z7982 Long term (current) use of aspirin: Secondary | ICD-10-CM | POA: Diagnosis not present

## 2018-04-26 DIAGNOSIS — Z7901 Long term (current) use of anticoagulants: Secondary | ICD-10-CM | POA: Diagnosis not present

## 2018-04-26 DIAGNOSIS — I4891 Unspecified atrial fibrillation: Secondary | ICD-10-CM | POA: Diagnosis not present

## 2018-04-26 DIAGNOSIS — M353 Polymyalgia rheumatica: Secondary | ICD-10-CM | POA: Diagnosis not present

## 2018-04-26 DIAGNOSIS — Z96651 Presence of right artificial knee joint: Secondary | ICD-10-CM | POA: Diagnosis not present

## 2018-04-26 DIAGNOSIS — N400 Enlarged prostate without lower urinary tract symptoms: Secondary | ICD-10-CM | POA: Diagnosis not present

## 2018-04-26 DIAGNOSIS — Z471 Aftercare following joint replacement surgery: Secondary | ICD-10-CM | POA: Diagnosis not present

## 2018-04-26 DIAGNOSIS — Z9181 History of falling: Secondary | ICD-10-CM | POA: Diagnosis not present

## 2018-04-28 ENCOUNTER — Encounter (HOSPITAL_COMMUNITY): Payer: Self-pay | Admitting: Orthopedic Surgery

## 2018-05-02 ENCOUNTER — Encounter (HOSPITAL_COMMUNITY): Payer: Self-pay | Admitting: Orthopedic Surgery

## 2018-05-05 DIAGNOSIS — Z4789 Encounter for other orthopedic aftercare: Secondary | ICD-10-CM | POA: Diagnosis not present

## 2018-05-05 DIAGNOSIS — Z471 Aftercare following joint replacement surgery: Secondary | ICD-10-CM | POA: Diagnosis not present

## 2018-05-19 ENCOUNTER — Ambulatory Visit: Payer: PPO | Attending: Orthopedic Surgery | Admitting: Physical Therapy

## 2018-05-19 ENCOUNTER — Encounter: Payer: Self-pay | Admitting: Physical Therapy

## 2018-05-19 ENCOUNTER — Other Ambulatory Visit: Payer: Self-pay

## 2018-05-19 DIAGNOSIS — R600 Localized edema: Secondary | ICD-10-CM | POA: Diagnosis not present

## 2018-05-19 DIAGNOSIS — M25661 Stiffness of right knee, not elsewhere classified: Secondary | ICD-10-CM | POA: Diagnosis not present

## 2018-05-19 DIAGNOSIS — R262 Difficulty in walking, not elsewhere classified: Secondary | ICD-10-CM | POA: Diagnosis not present

## 2018-05-19 DIAGNOSIS — M25561 Pain in right knee: Secondary | ICD-10-CM | POA: Diagnosis not present

## 2018-05-19 NOTE — Therapy (Signed)
[place], [place], [address] Phone: [phone]   Fax:  [phone]  Physical Therapy Evaluation  Patient Details  Name: Frank Hudson MRN: [id number] Date of Birth: 10-31-1950 Referring Provider (PT): Carlynn Spry Date: 05/19/2018  PT End of Session - 05/19/18 1143    Visit Number  1    Date for PT Re-Evaluation  07/18/18    PT Start Time  1014    PT Stop Time  1110    PT Time Calculation (min)  56 min    Activity Tolerance  Patient tolerated treatment well    Behavior During Therapy  [facility] for tasks assessed/performed       Past Medical History:  Diagnosis Date  . Arthritis   . BPH (benign prostatic hyperplasia)   . Osteoarthritis of right knee    End Stage  . PMR (polymyalgia rheumatica) (HCC)     Past Surgical History:  Procedure Laterality Date  . APPENDECTOMY    . ELBOW SURGERY Left 2015  . MENISCECTOMY Right 1978?  . TOTAL KNEE ARTHROPLASTY Right 04/22/2018   Procedure: RIGHT TOTAL KNEE ARTHROPLASTY;  Surgeon: [name], MD;  Location: [place];  Service: Orthopedics;  Laterality: Right;    There were no vitals filed for this visit.   Subjective Assessment - 05/19/18 1016    Subjective  Patient underwent right TKA on 05/02/2018.  Had 6 home PT visits.  He reports that he had been having problems with the knee for years.      Limitations  Walking;Lifting;Standing;House hold activities    Patient Stated Goals  have great ROM, strength and walk without difficulty     Currently in Pain?  Yes    Pain Score  2     Pain Location  Knee    Pain Orientation  Right    Pain Descriptors / Indicators  Sore    Pain Type  Acute pain;Surgical pain    Pain Onset  1 to 4 weeks ago    Pain Frequency  Constant    Aggravating Factors   bending, walking, at night as the day goes pain up to 7/10    Pain Relieving Factors  rest, ice pain meds pain a 2/10    Effect of Pain on Daily  Activities  poor motion, difficulty walking         [facility] PT Assessment - 05/19/18 0001      Assessment   Medical Diagnosis  right TKA    Referring Provider (PT)  Norris    Onset Date/Surgical Date  05/02/18    Prior Therapy  at home 6 visits      Precautions   Precautions  None      Balance Screen   Has the patient fallen in the past 6 months  No    Has the patient had a decrease in activity level because of a fear of falling?   No    Is the patient reluctant to leave their home because of a fear of falling?   No      Home Environment   Additional Comments  has stairs, does housework and yardwork      Prior Function   Level of Independence  Independent    Vocation  Retired    Leisure  golf 4x/week, rides a bicycle, likes to go to the gym      Observation/Other Assessments-Edema    Edema  Circumferential  Circumferential Edema   Circumferential - Right  47.5 cm    Circumferential - Left   41 cm      ROM / Strength   AROM / PROM / Strength  AROM;PROM;Strength      AROM   AROM Assessment Site  Knee    Right/Left Knee  Right    Right Knee Extension  28    Right Knee Flexion  85      PROM   Overall PROM Comments  pain with PROM    PROM Assessment Site  Knee    Right/Left Knee  Right    Right Knee Extension  20    Right Knee Flexion  88      Strength   Overall Strength Comments  4-/5      Palpation   Palpation comment  mild warmth, scar is puckering, edema, ballotable patella      Standardized Balance Assessment   Standardized Balance Assessment  Timed Up and Go Test      Timed Up and Go Test   Normal TUG (seconds)  14                Objective measurements completed on examination: See above findings.      [place] [facility] PT Treatment/Exercise - 05/19/18 0001      Ambulation/Gait   Gait Comments  uses a SPC, slow, antalgic on the right, stiff right leg, stairs one at a time      Exercises   Exercises  Knee/Hip      Knee/Hip Exercises:  Aerobic   Nustep  level 3 x 6 minutes      Modalities   Modalities  Vasopneumatic      Vasopneumatic   Number Minutes Vasopneumatic   15 minutes    Vasopnuematic Location   Knee    Vasopneumatic Pressure  Medium    Vasopneumatic Temperature   33             PT Education - 05/19/18 1143    Education Details  shoed patient low load long duration stretch for flexion and extension    Person(s) Educated  Patient    Methods  Explanation;Demonstration    Comprehension  Verbalized understanding       PT Short Term Goals - 05/19/18 1146      PT SHORT TERM GOAL #1   Title  independent with initial HEP    Time  2    Period  Weeks    Status  New        PT Long Term Goals - 05/19/18 1146      PT LONG TERM GOAL #1   Title  ascend and descend stairs step over step    Time  8    Period  Weeks    Status  New      PT LONG TERM GOAL #2   Title  decrease pain 50%    Time  8    Period  Weeks    Status  New      PT LONG TERM GOAL #3   Title  increase AROM to 5-115 degrees flexion    Time  8    Period  Weeks    Status  New      PT LONG TERM GOAL #4   Title  return safely to the gym    Time  8    Period  Weeks    Status  New  Plan - 05/19/18 1144    Clinical Impression Statement  Patient reports that he underwent a right TKR on 05/02/18.  He is stiff, has a lot of edema, the scar is puckered.  He walks wiht a SPC, slow, antalgic on the right with stiff legged gait and mild circumduction.  AROM was 28-86 degrees flexion    Clinical Presentation  Stable    Clinical Decision Making  Low    Rehab Potential  Good    PT Frequency  3x / week    PT Duration  8 weeks    PT Treatment/Interventions  ADLs/Self Care Home Management;Cryotherapy;Electrical Stimulation;Therapeutic exercise;Therapeutic activities;Functional mobility training;Stair training;Gait training;Balance training;Neuromuscular re-education;Patient/family education;Manual  techniques;Vasopneumatic Device    PT Next Visit Plan  add exercises and ROM, assure his gait is better    Consulted and Agree with Plan of Care  Patient       Patient will benefit from skilled therapeutic intervention in order to improve the following deficits and impairments:  Abnormal gait, Pain, Decreased scar mobility, Decreased range of motion, Decreased strength, Increased edema, Difficulty walking, Decreased balance  Visit Diagnosis: Acute pain of right knee - Plan: PT plan of care cert/re-cert  Stiffness of right knee, not elsewhere classified - Plan: PT plan of care cert/re-cert  Difficulty in walking, not elsewhere classified - Plan: PT plan of care cert/re-cert  Localized edema - Plan: PT plan of care cert/re-cert     Problem List Patient Active Problem List   Diagnosis Date Noted  . New onset atrial fibrillation ([place])   . 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

## 2018-05-20 ENCOUNTER — Ambulatory Visit (INDEPENDENT_AMBULATORY_CARE_PROVIDER_SITE_OTHER): Payer: PPO | Admitting: Physician Assistant

## 2018-05-20 ENCOUNTER — Encounter: Payer: Self-pay | Admitting: Physician Assistant

## 2018-05-20 VITALS — BP 124/83 | HR 85 | Ht 67.5 in | Wt 202.0 lb

## 2018-05-20 DIAGNOSIS — I4891 Unspecified atrial fibrillation: Secondary | ICD-10-CM | POA: Diagnosis not present

## 2018-05-20 MED ORDER — METOPROLOL TARTRATE 25 MG PO TABS: 12.5000 mg | ORAL_TABLET | Freq: Two times a day (BID) | ORAL | 1 refills | Status: DC

## 2018-05-20 NOTE — Patient Instructions (Addendum)
Medication Instructions:  Start Metoprolol 12.5 mg twice daily. Stop Aspirin. If you need a refill on your cardiac medications before your next appointment, please call your pharmacy.   Lab work: BMET, CBC within one week on the Cardioversion. Jan 10th-Jan13th If you have labs (blood work) drawn today and your tests are completely normal, you will receive your results only by: Marland Kitchen MyChart Message (if you have MyChart) OR . A paper copy in the mail If you have any lab test that is abnormal or we need to change your treatment, we will call you to review the results.  Testing/Procedures: Your physician has requested that you have a Cardioversion.  Electrical Cardioversion uses a jolt of electricity to your heart either through paddles or wired patches attached to your chest. This is a controlled, usually prescheduled, procedure. This procedure is done at the hospital and you are not awake during the procedure. You usually go home the day of the procedure. Please see the instruction sheet given to you today for more information.  Echocardiogram after cardioversion before appointment with Dr.Hilty in 4 weeks. - Your physician has requested that you have an echocardiogram. Echocardiography is a painless test that uses sound waves to create images of your heart. It provides your doctor with information about the size and shape of your heart and how well your heart's chambers and valves are working. This procedure takes approximately one hour. There are no restrictions for this procedure. This will be performed at our [facility] location - [address], [address].   Follow-Up: At [facility], you and your health needs are our priority.  As part of our continuing mission to provide you with exceptional heart care, we have created designated Provider Care Teams.  These Care Teams include your primary Cardiologist (physician) and Advanced Practice Providers (APPs -  Physician Assistants and Nurse  Practitioners) who all work together to provide you with the care you need, when you need it. . You will need a follow up appointment in 4 weeks AFTER Cardioversion.  You may see Dr.Hilty ONLY.  Any Other Special Instructions Will Be Listed Below (If Applicable).  You are scheduled for a Cardioversion on January 15th, 2020 with Dr. [name].  Please arrive at the [facility] (Main Entrance A) at [facility]: [address] [place], [place] [address] at 12:00 pm. (1 hour prior to procedure unless lab work is needed; if lab work is needed arrive 1.5 hours ahead)  DIET: Nothing to eat or drink after midnight except a sip of water with medications (see medication instructions below)  Medication Instructions:  Continue your anticoagulant: Eliquis  You will need to continue your anticoagulant after your procedure until you  are told by your  Provider that it is safe to stop   Labs: CBC, BMET in 1 week before Cardioversion.  You must have a responsible person to drive you home and stay in the waiting area during your procedure. Failure to do so could result in cancellation.  Bring your insurance cards.  *Special Note: Every effort is made to have your procedure done on time. Occasionally there are emergencies that occur at the hospital that may cause delays. Please be patient if a delay does occur.

## 2018-05-20 NOTE — Progress Notes (Addendum)
Cardiology Office Note    Date:  05/22/2018   ID:  Indalecio, Malmstrom 09-10-1950, MRN [id number]  PCP:  [name], MD  Cardiologist:  Dr. [name]  Chief Complaint  Patient presents with  . Follow-up    History of Present Illness:  Frank Hudson is a 68 y.o. male with PMH of polymyalgia rheumatica and BPH who recently underwent right total knee arthroscopy.  It was incidentally noted patient was in rate controlled atrial fibrillation on the monitor.  He was started on Eliquis 5 mg twice daily.  Echocardiogram was ordered however it does not appear to have been done.  The plan was to rate control the patient with plan for outpatient DC cardioversion in 3 weeks.  Patient presents today for cardiology office visit.  He has no cardiac awareness of atrial fibrillation.  He is on Eliquis and aspirin at the same time.  I will discontinue aspirin.  His heart rate is in the 80s, I will add low-dose metoprolol 12.5 mg twice daily.  I will set him up for outpatient DC cardioversion.  He has been compliant with Eliquis and has not missed any doses recently.  He can follow-up with Dr. [name] 4 weeks after the cardioversion.  I will defer to Dr. [name] to decide whether or not to continue Eliquis or switch back to aspirin given his low CHA2DS2-Vasc score.  He will need a echocardiogram before his follow-up with Dr. [name].   Past Medical History:  Diagnosis Date  . Arthritis   . BPH (benign prostatic hyperplasia)   . Osteoarthritis of right knee    End Stage  . PMR (polymyalgia rheumatica) (HCC)     Past Surgical History:  Procedure Laterality Date  . APPENDECTOMY    . ELBOW SURGERY Left 2015  . MENISCECTOMY Right 1978?  . TOTAL KNEE ARTHROPLASTY Right 04/22/2018   Procedure: RIGHT TOTAL KNEE ARTHROPLASTY;  Surgeon: [name], MD;  Location: [place];  Service: Orthopedics;  Laterality: Right;    Current Medications: Outpatient Medications Prior to Visit  Medication Sig Dispense  Refill  . apixaban (ELIQUIS) 5 MG TABS tablet Take 1 tablet (5 mg total) by mouth 2 (two) times daily. 60 tablet 0  . dutasteride (AVODART) 0.5 MG capsule Take 0.5 mg by mouth every other day.    . oxyCODONE-acetaminophen (PERCOCET) 5-325 MG tablet Take 1 tablet by mouth every 4 (four) hours as needed for severe pain. 30 tablet 0  . aspirin (ASPIRIN CHILDRENS) 81 MG chewable tablet Chew 1 tablet (81 mg total) by mouth 2 (two) times daily. 60 tablet 0  . methocarbamol (ROBAXIN) 500 MG tablet Take 1 tablet (500 mg total) by mouth 3 (three) times daily as needed. 60 tablet 1  . Multiple Vitamins-Minerals (MULTIVITAMIN PO) Take 1 tablet by mouth daily.    . naproxen sodium (ALEVE) 220 MG tablet Take 220 mg by mouth daily as needed (for pain or headache).     No facility-administered medications prior to visit.      Allergies:   Amoxicillin   Social History   Socioeconomic History  . Marital status: Married    Spouse name: Not on file  . Number of children: Not on file  . Years of education: Not on file  . Highest education level: Not on file  Occupational History  . Not on file  Social Needs  . Financial resource strain: Not on file  . Food insecurity:    Worry: Not on file  Inability: Not on file  . Transportation needs:    Medical: Not on file    Non-medical: Not on file  Tobacco Use  . Smoking status: Former Smoker    Types: Cigarettes, Cigars    Last attempt to quit: 2004    Years since quitting: 16.0  . Smokeless tobacco: Never Used  Substance and Sexual Activity  . Alcohol use: Yes    Alcohol/week: 14.0 standard drinks    Types: 14 Standard drinks or equivalent per week  . Drug use: Not on file  . Sexual activity: Not on file  Lifestyle  . Physical activity:    Days per week: Not on file    Minutes per session: Not on file  . Stress: Not on file  Relationships  . Social connections:    Talks on phone: Not on file    Gets together: Not on file    Attends  religious service: Not on file    Active member of club or organization: Not on file    Attends meetings of clubs or organizations: Not on file    Relationship status: Not on file  Other Topics Concern  . Not on file  Social History Narrative  . Not on file     Family History:  The patient's family history is not on file.   ROS:   Please see the history of present illness.    ROS All other systems reviewed and are negative.   PHYSICAL EXAM:   VS:  BP 124/83   Pulse 85   Ht 5' 7.5" (1.715 m)   Wt 202 lb (91.6 kg)   BMI 31.17 kg/m    GEN: Well nourished, well developed, in no acute distress  HEENT: normal  Neck: no JVD, carotid bruits, or masses Cardiac: Irregularly irregular; no murmurs, rubs, or gallops,no edema  Respiratory:  clear to auscultation bilaterally, normal work of breathing GI: soft, nontender, nondistended, + BS MS: no deformity or atrophy  Skin: warm and dry, no rash Neuro:  Alert and Oriented x 3, Strength and sensation are intact Psych: euthymic mood, full affect  Wt Readings from Last 3 Encounters:  05/20/18 202 lb (91.6 kg)  04/22/18 202 lb (91.6 kg)  04/12/18 207 lb 14.4 oz (94.3 kg)      Studies/Labs Reviewed:   EKG:  EKG is ordered today.  The ekg ordered today demonstrates atrial fibrillation with HR 83  Recent Labs: 04/23/2018: BUN 13; Creatinine, Ser 1.22; Potassium 4.1; Sodium 134 04/24/2018: Hemoglobin 11.7; Platelets 167   Lipid Panel No results found for: CHOL, TRIG, HDL, CHOLHDL, VLDL, LDLCALC, LDLDIRECT  Additional studies/ records that were reviewed today include:   N/A   ASSESSMENT:    1. Atrial fibrillation, unspecified type ([place])      PLAN:  In order of problems listed above:  1. Persistent atrial fibrillation: On Eliquis, stop aspirin.  Will also start on metoprolol 12.5 mg twice daily for better rate control.  Patient has been compliant with Eliquis for at least 3 weeks, will arrange outpatient DC cardioversion.   Echocardiogram was recommended in the hospital, however does not appear to have been done, will arrange for outpatient echo.    Medication Adjustments/Labs and Tests Ordered: Current medicines are reviewed at length with the patient today.  Concerns regarding medicines are outlined above.  Medication changes, Labs and Tests ordered today are listed in the Patient Instructions below. Patient Instructions  Medication Instructions:  Start Metoprolol 12.5 mg twice daily. Stop Aspirin.  If you need a refill on your cardiac medications before your next appointment, please call your pharmacy.   Lab work: BMET, CBC within one week on the Cardioversion. Jan 10th-Jan13th If you have labs (blood work) drawn today and your tests are completely normal, you will receive your results only by: Marland Kitchen MyChart Message (if you have MyChart) OR . A paper copy in the mail If you have any lab test that is abnormal or we need to change your treatment, we will call you to review the results.  Testing/Procedures: Your physician has requested that you have a Cardioversion.  Electrical Cardioversion uses a jolt of electricity to your heart either through paddles or wired patches attached to your chest. This is a controlled, usually prescheduled, procedure. This procedure is done at the hospital and you are not awake during the procedure. You usually go home the day of the procedure. Please see the instruction sheet given to you today for more information.  Echocardiogram after cardioversion before appointment with Dr.Hilty in 4 weeks. - Your physician has requested that you have an echocardiogram. Echocardiography is a painless test that uses sound waves to create images of your heart. It provides your doctor with information about the size and shape of your heart and how well your heart's chambers and valves are working. This procedure takes approximately one hour. There are no restrictions for this procedure. This will be  performed at our [facility] location - [address].   Follow-Up: At [facility], you and your health needs are our priority.  As part of our continuing mission to provide you with exceptional heart care, we have created designated Provider Care Teams.  These Care Teams include your primary Cardiologist (physician) and Advanced Practice Providers (APPs -  Physician Assistants and Nurse Practitioners) who all work together to provide you with the care you need, when you need it. . You will need a follow up appointment in 4 weeks AFTER Cardioversion.  You may see Dr.Hilty ONLY.  Any Other Special Instructions Will Be Listed Below (If Applicable).  You are scheduled for a Cardioversion on January 15th, 2020 with Dr. [name].  Please arrive at the [facility] (Main Entrance A) at [facility]: [address] [place], [place] [address] at 12:00 pm. (1 hour prior to procedure unless lab work is needed; if lab work is needed arrive 1.5 hours ahead)  DIET: Nothing to eat or drink after midnight except a sip of water with medications (see medication instructions below)  Medication Instructions:  Continue your anticoagulant: Eliquis  You will need to continue your anticoagulant after your procedure until you  are told by your  Provider that it is safe to stop   Labs: CBC, BMET in 1 week before Cardioversion.  You must have a responsible person to drive you home and stay in the waiting area during your procedure. Failure to do so could result in cancellation.  Bring your insurance cards.  *Special Note: Every effort is made to have your procedure done on time. Occasionally there are emergencies that occur at the hospital that may cause delays. Please be patient if a delay does occur.        Hilbert Corrigan, [place]  05/22/2018 10:09 PM    [facility] [place], [place], [place]  [address] Phone: [phone]; Fax: [phone]

## 2018-05-22 ENCOUNTER — Encounter: Payer: Self-pay | Admitting: Physician Assistant

## 2018-05-23 ENCOUNTER — Ambulatory Visit: Payer: PPO | Admitting: Physical Therapy

## 2018-05-23 DIAGNOSIS — M25661 Stiffness of right knee, not elsewhere classified: Secondary | ICD-10-CM

## 2018-05-23 DIAGNOSIS — R600 Localized edema: Secondary | ICD-10-CM

## 2018-05-23 DIAGNOSIS — R262 Difficulty in walking, not elsewhere classified: Secondary | ICD-10-CM

## 2018-05-23 DIAGNOSIS — M25561 Pain in right knee: Secondary | ICD-10-CM | POA: Diagnosis not present

## 2018-05-23 NOTE — Patient Instructions (Signed)
Access Code: [email or web address]  URL: [email or web address]  Date: 05/23/2018  Prepared by: Almyra Free Stephaine Breshears   Exercises  Prone Quadriceps Stretch with Strap - 10-20 reps - 1 sets - 5-30 sec hold - 2-3x daily - 7x weekly  Standing Knee Flexion Stretch on Step - 10 reps - 3 sets - 1x daily - 7x weekly  Prone Knee Extension with Ankle Weight - 1 reps - 1 sets - 1-5 min hold - 2x daily - 7x weekly

## 2018-05-23 NOTE — Therapy (Signed)
[place], [place], [address] Phone: [phone]   Fax:  [phone]  Physical Therapy Treatment  Patient Details  Name: Frank Hudson MRN: [id number] Date of Birth: [birth year] Referring Provider (PT): Carlynn Spry Date: 05/23/2018  PT End of Session - 05/23/18 1021    Visit Number  2    Date for PT Re-Evaluation  07/18/18    PT Start Time  1018    PT Stop Time  1115    PT Time Calculation (min)  57 min    Activity Tolerance  Patient tolerated treatment well    Behavior During Therapy  [facility] for tasks assessed/performed       Past Medical History:  Diagnosis Date  . Arthritis   . BPH (benign prostatic hyperplasia)   . Osteoarthritis of right knee    End Stage  . PMR (polymyalgia rheumatica) (HCC)     Past Surgical History:  Procedure Laterality Date  . APPENDECTOMY    . ELBOW SURGERY Left 2015  . MENISCECTOMY Right 1978?  . TOTAL KNEE ARTHROPLASTY Right 04/22/2018   Procedure: RIGHT TOTAL KNEE ARTHROPLASTY;  Surgeon: [name], MD;  Location: [facility];  Service: Orthopedics;  Laterality: Right;    There were no vitals filed for this visit.  Subjective Assessment - 05/23/18 1022    Subjective  Patient reports no pain with walking but pain goes up to 6 or 7/10 with stretching on bike.    Patient Stated Goals  have great ROM, strength and walk without difficulty     Currently in Pain?  No/denies         [facility] PT Assessment - 05/23/18 0001      AROM   AROM Assessment Site  Knee    Right/Left Knee  Right    Right Knee Flexion  99      PROM   PROM Assessment Site  Knee    Right/Left Knee  Right    Right Knee Flexion  106                   [facility] Adult PT Treatment/Exercise - 05/23/18 0001      Knee/Hip Exercises: Stretches   Active Hamstring Stretch  --    Knee: Self-Stretch to increase Flexion  Right;60 seconds   then two holds of 30 sec on 6 inch step      Knee/Hip Exercises: Aerobic   Stationary Bike  partial revolutions x 5 min    Nustep  L6 x 6 min moving seat forward for greater flexion      Knee/Hip Exercises: Standing   Rocker Board  2 minutes    Other Standing Knee Exercises  semi tandem stance weight shifting x 10 each way with added DF      Knee/Hip Exercises: Supine   Terminal Knee Extension  AAROM;Strengthening;Right;2 sets;10 reps    Theraband Level (Terminal Knee Extension)  Level 3 (Green)      Knee/Hip Exercises: Prone   Prone Knee Hang  2 minutes      Modalities   Modalities  Vasopneumatic      Vasopneumatic   Number Minutes Vasopneumatic   15 minutes    Vasopnuematic Location   Knee    Vasopneumatic Pressure  Medium    Vasopneumatic Temperature   34      Manual Therapy   Manual Therapy  Passive ROM    Manual therapy comments  patellar mobs  Passive ROM  into flexion             PT Education - 05/23/18 1244    Education Details  HEP    Person(s) Educated  Patient    Methods  Explanation;Demonstration;Handout    Comprehension  Verbalized understanding;Returned demonstration       PT Short Term Goals - 05/19/18 1146      PT SHORT TERM GOAL #1   Title  independent with initial HEP    Time  2    Period  Weeks    Status  New        PT Long Term Goals - 05/19/18 1146      PT LONG TERM GOAL #1   Title  ascend and descend stairs step over step    Time  8    Period  Weeks    Status  New      PT LONG TERM GOAL #2   Title  decrease pain 50%    Time  8    Period  Weeks    Status  New      PT LONG TERM GOAL #3   Title  increase AROM to 5-115 degrees flexion    Time  8    Period  Weeks    Status  New      PT LONG TERM GOAL #4   Title  return safely to the gym    Time  8    Period  Weeks    Status  New            Plan - 05/23/18 1246    Clinical Impression Statement  Patient did well today with TE and is making progress with right knee flexion. He measured 99 deg Active  and 106 passive at end of treatment. Patient is compliant with HEP and new stretches were given today,    PT Frequency  3x / week    PT Duration  8 weeks    PT Treatment/Interventions  ADLs/Self Care Home Management;Cryotherapy;Electrical Stimulation;Therapeutic exercise;Therapeutic activities;Functional mobility training;Stair training;Gait training;Balance training;Neuromuscular re-education;Patient/family education;Manual techniques;Vasopneumatic Device    PT Next Visit Plan  add exercises and ROM, assure his gait is better    PT Home Exercise Plan  prone knee flexion with strap, prone knee hang, lunges on step for flexion    Consulted and Agree with Plan of Care  Patient       Patient will benefit from skilled therapeutic intervention in order to improve the following deficits and impairments:  Abnormal gait, Pain, Decreased scar mobility, Decreased range of motion, Decreased strength, Increased edema, Difficulty walking, Decreased balance  Visit Diagnosis: Acute pain of right knee  Stiffness of right knee, not elsewhere classified  Difficulty in walking, not elsewhere classified  Localized edema     Problem List Patient Active Problem List   Diagnosis Date Noted  . New onset atrial fibrillation ([place])   . 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

## 2018-05-24 ENCOUNTER — Other Ambulatory Visit: Payer: Self-pay | Admitting: Physician Assistant

## 2018-05-24 NOTE — Addendum Note (Signed)
Addended by: Leland Johns A on: 05/24/2018 09:48 AM   Modules accepted: Orders

## 2018-05-25 ENCOUNTER — Encounter: Payer: Self-pay | Admitting: Physical Therapy

## 2018-05-25 ENCOUNTER — Ambulatory Visit: Payer: PPO | Admitting: Physical Therapy

## 2018-05-25 DIAGNOSIS — R600 Localized edema: Secondary | ICD-10-CM

## 2018-05-25 DIAGNOSIS — M25561 Pain in right knee: Secondary | ICD-10-CM | POA: Diagnosis not present

## 2018-05-25 DIAGNOSIS — R262 Difficulty in walking, not elsewhere classified: Secondary | ICD-10-CM

## 2018-05-25 DIAGNOSIS — M25661 Stiffness of right knee, not elsewhere classified: Secondary | ICD-10-CM

## 2018-05-25 NOTE — Therapy (Signed)
[place], [place], [address] Phone: [phone]   Fax:  [phone]  Physical Therapy Treatment  Patient Details  Name: Frank Hudson MRN: [id number] Date of Birth: 09/02/1950 Referring Provider (PT): Carlynn Spry Date: 05/25/2018  PT End of Session - 05/25/18 1141    Visit Number  3    Date for PT Re-Evaluation  07/18/18    PT Start Time  1100    PT Stop Time  1156    PT Time Calculation (min)  56 min    Activity Tolerance  Patient tolerated treatment well    Behavior During Therapy  [facility] for tasks assessed/performed       Past Medical History:  Diagnosis Date  . Arthritis   . BPH (benign prostatic hyperplasia)   . Osteoarthritis of right knee    End Stage  . PMR (polymyalgia rheumatica) (HCC)     Past Surgical History:  Procedure Laterality Date  . APPENDECTOMY    . ELBOW SURGERY Left 2015  . MENISCECTOMY Right 1978?  . TOTAL KNEE ARTHROPLASTY Right 04/22/2018   Procedure: RIGHT TOTAL KNEE ARTHROPLASTY;  Surgeon: [name], MD;  Location: [facility];  Service: Orthopedics;  Laterality: Right;    There were no vitals filed for this visit.  Subjective Assessment - 05/25/18 1102    Subjective  "it seems like it is always stiff"    Currently in Pain?  No/denies    Pain Score  3     Pain Location  Knee    Pain Orientation  Right                       [facility] Adult PT Treatment/Exercise - 05/25/18 0001      Knee/Hip Exercises: Aerobic   Stationary Bike  partial revolutions x 4 min    Nustep  L5 x 6 min       Knee/Hip Exercises: Machines for Strengthening   Total Gym Leg Press  20lb 2x10       Knee/Hip Exercises: Standing   Forward Step Up  Right;2 sets;10 reps;Hand Hold: 0;Step Height: 4"      Knee/Hip Exercises: Seated   Long Arc Quad  Right;2 sets;10 reps;Weights    Long Arc Quad Weight  3 lbs.    Hamstring Curl  Right;Strengthening;2 sets;15 reps     Hamstring Limitations  green tband    Sit to Sand  2 sets;10 reps;without UE support   Lowered UBE     Modalities   Modalities  Vasopneumatic      Vasopneumatic   Number Minutes Vasopneumatic   15 minutes    Vasopnuematic Location   Knee    Vasopneumatic Pressure  Medium    Vasopneumatic Temperature   34      Manual Therapy   Manual Therapy  Passive ROM    Manual therapy comments  patellar mobs     Passive ROM  R knee flex and ext                PT Short Term Goals - 05/25/18 1148      PT SHORT TERM GOAL #1   Title  independent with initial HEP    Status  Achieved        PT Long Term Goals - 05/19/18 1146      PT LONG TERM GOAL #1   Title  ascend and descend stairs step over step  Time  8    Period  Weeks    Status  New      PT LONG TERM GOAL #2   Title  decrease pain 50%    Time  8    Period  Weeks    Status  New      PT LONG TERM GOAL #3   Title  increase AROM to 5-115 degrees flexion    Time  8    Period  Weeks    Status  New      PT LONG TERM GOAL #4   Title  return safely to the gym    Time  8    Period  Weeks    Status  New            Plan - 05/25/18 1142    Clinical Impression Statement  Pt continues to do well with TE. R knee flexion is well, but lacking some extension. Guarded initially with MT but seemed to relax after a few reps. Some pain with passive R knee extension. He did well with all exercises.     Rehab Potential  Good    PT Frequency  3x / week    PT Duration  8 weeks    PT Treatment/Interventions  ADLs/Self Care Home Management;Cryotherapy;Electrical Stimulation;Therapeutic exercise;Therapeutic activities;Functional mobility training;Stair training;Gait training;Balance training;Neuromuscular re-education;Patient/family education;Manual techniques;Vasopneumatic Device    PT Next Visit Plan  add exercises and ROM, assure his gait is better       Patient will benefit from skilled therapeutic intervention in order  to improve the following deficits and impairments:  Abnormal gait, Pain, Decreased scar mobility, Decreased range of motion, Decreased strength, Increased edema, Difficulty walking, Decreased balance  Visit Diagnosis: Acute pain of right knee  Difficulty in walking, not elsewhere classified  Stiffness of right knee, not elsewhere classified  Localized edema     Problem List Patient Active Problem List   Diagnosis Date Noted  . New onset atrial fibrillation ([place])   . 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

## 2018-05-27 ENCOUNTER — Ambulatory Visit: Payer: PPO | Admitting: Physical Therapy

## 2018-05-27 ENCOUNTER — Encounter: Payer: Self-pay | Admitting: Physical Therapy

## 2018-05-27 DIAGNOSIS — R600 Localized edema: Secondary | ICD-10-CM

## 2018-05-27 DIAGNOSIS — M25661 Stiffness of right knee, not elsewhere classified: Secondary | ICD-10-CM

## 2018-05-27 DIAGNOSIS — R262 Difficulty in walking, not elsewhere classified: Secondary | ICD-10-CM

## 2018-05-27 DIAGNOSIS — I4891 Unspecified atrial fibrillation: Secondary | ICD-10-CM | POA: Diagnosis not present

## 2018-05-27 DIAGNOSIS — M25561 Pain in right knee: Secondary | ICD-10-CM

## 2018-05-27 NOTE — Therapy (Signed)
[place], [place], [address] Phone: [phone]   Fax:  [phone]  Physical Therapy Treatment  Patient Details  Name: Frank Hudson MRN: [id number] Date of Birth: 10/18/1950 Referring Provider (PT): Carlynn Spry Date: 05/27/2018    Past Medical History:  Diagnosis Date  . Arthritis   . BPH (benign prostatic hyperplasia)   . Osteoarthritis of right knee    End Stage  . PMR (polymyalgia rheumatica) (HCC)     Past Surgical History:  Procedure Laterality Date  . APPENDECTOMY    . ELBOW SURGERY Left 2015  . MENISCECTOMY Right 1978?  . TOTAL KNEE ARTHROPLASTY Right 04/22/2018   Procedure: RIGHT TOTAL KNEE ARTHROPLASTY;  Surgeon: [name], MD;  Location: [facility] [place];  Service: Orthopedics;  Laterality: Right;    There were no vitals filed for this visit.  Subjective Assessment - 05/27/18 1101    Subjective  "Im good"    Currently in Pain?  Yes    Pain Score  3     Pain Location  Knee    Pain Orientation  Right                       [facility] Adult PT Treatment/Exercise - 05/27/18 0001      Knee/Hip Exercises: Aerobic   Stationary Bike  partial revolutions x 4 min    Nustep  L2 x 5 min N0 UE        Knee/Hip Exercises: Machines for Strengthening   Cybex Knee Flexion  5lb 2x10     Cybex Leg Press  20lb 2x10     Total Gym Leg Press  20lb 3x10       Knee/Hip Exercises: Standing   Forward Step Up  Right;2 sets;10 reps;Hand Hold: 0;Step Height: 4";Step Height: 6"    Walking with Sports Cord  30lb 3 way x3 each      Knee/Hip Exercises: Supine   Short Arc Quad Sets  Right;2 sets;10 reps   manual resistance      Modalities   Modalities  Vasopneumatic      Vasopneumatic   Number Minutes Vasopneumatic   15 minutes    Vasopnuematic Location   Knee    Vasopneumatic Pressure  Medium    Vasopneumatic Temperature   34      Manual Therapy   Manual Therapy  Passive  ROM    Manual therapy comments  patellar mobs     Passive ROM  R knee flex and ext                PT Short Term Goals - 05/25/18 1148      PT SHORT TERM GOAL #1   Title  independent with initial HEP    Status  Achieved        PT Long Term Goals - 05/19/18 1146      PT LONG TERM GOAL #1   Title  ascend and descend stairs step over step    Time  8    Period  Weeks    Status  New      PT LONG TERM GOAL #2   Title  decrease pain 50%    Time  8    Period  Weeks    Status  New      PT LONG TERM GOAL #3   Title  increase AROM to 5-115 degrees flexion    Time  8    Period  Weeks    Status  New      PT LONG TERM GOAL #4   Title  return safely to the gym    Time  8    Period  Weeks    Status  New            Plan - 05/27/18 1141    Clinical Impression Statement  Pt did well with a progressed treatment session. No issues with machine level interventions. Cues not to compensate step ups. Cues to take a bigger step with LLE during resisted backwards walking. some instability with resisted side steps.      Rehab Potential  Good    PT Frequency  3x / week    PT Duration  8 weeks    PT Treatment/Interventions  ADLs/Self Care Home Management;Cryotherapy;Electrical Stimulation;Therapeutic exercise;Therapeutic activities;Functional mobility training;Stair training;Gait training;Balance training;Neuromuscular re-education;Patient/family education;Manual techniques;Vasopneumatic Device    PT Next Visit Plan  add exercises and ROM, assure his gait is better       Patient will benefit from skilled therapeutic intervention in order to improve the following deficits and impairments:  Abnormal gait, Pain, Decreased scar mobility, Decreased range of motion, Decreased strength, Increased edema, Difficulty walking, Decreased balance  Visit Diagnosis: Acute pain of right knee  Difficulty in walking, not elsewhere classified  Stiffness of right knee, not elsewhere  classified  Localized edema     Problem List Patient Active Problem List   Diagnosis Date Noted  . New onset atrial fibrillation ([place])   . 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

## 2018-05-28 LAB — CBC
Hematocrit: 46.3 % (ref 37.5–51.0)
Hemoglobin: 15.3 g/dL (ref 13.0–17.7)
MCH: 31.6 pg (ref 26.6–33.0)
MCHC: 33 g/dL (ref 31.5–35.7)
MCV: 96 fL (ref 79–97)
Platelets: 249 10*3/uL (ref 150–450)
RBC: 4.84 x10E6/uL (ref 4.14–5.80)
RDW: 12.3 % (ref 11.6–15.4)
WBC: 6 10*3/uL (ref 3.4–10.8)

## 2018-05-28 LAB — BASIC METABOLIC PANEL
BUN/Creatinine Ratio: 15 (ref 10–24)
BUN: 15 mg/dL (ref 8–27)
CO2: 25 mmol/L (ref 20–29)
Calcium: 10.2 mg/dL (ref 8.6–10.2)
Chloride: 101 mmol/L (ref 96–106)
Creatinine, Ser: 0.99 mg/dL (ref 0.76–1.27)
GFR calc non Af Amer: 77 mL/min/{1.73_m2} (ref >59)
GFR, EST AFRICAN AMERICAN: 89 mL/min/{1.73_m2} (ref >59)
GLUCOSE: 101 mg/dL — AB (ref 65–99)
Potassium: 5 mmol/L (ref 3.5–5.2)
Sodium: 143 mmol/L (ref 134–144)

## 2018-05-30 ENCOUNTER — Encounter: Payer: Self-pay | Admitting: Physical Therapy

## 2018-05-30 ENCOUNTER — Ambulatory Visit: Payer: PPO | Admitting: Physical Therapy

## 2018-05-30 DIAGNOSIS — M25561 Pain in right knee: Secondary | ICD-10-CM | POA: Diagnosis not present

## 2018-05-30 DIAGNOSIS — R262 Difficulty in walking, not elsewhere classified: Secondary | ICD-10-CM

## 2018-05-30 DIAGNOSIS — M25661 Stiffness of right knee, not elsewhere classified: Secondary | ICD-10-CM

## 2018-05-30 DIAGNOSIS — R600 Localized edema: Secondary | ICD-10-CM

## 2018-05-30 NOTE — Therapy (Signed)
[place], [place], [address] Phone: [phone]   Fax:  [phone]  Physical Therapy Treatment  Patient Details  Name: Frank Hudson MRN: [id number] Date of Birth: 09-Aug-1950 Referring Provider (PT): Carlynn Spry Date: 05/30/2018  PT End of Session - 05/30/18 1144    Visit Number  4    Date for PT Re-Evaluation  07/18/18    PT Start Time  1100    PT Stop Time  1158    PT Time Calculation (min)  58 min    Activity Tolerance  Patient tolerated treatment well    Behavior During Therapy  [facility] for tasks assessed/performed       Past Medical History:  Diagnosis Date  . Arthritis   . BPH (benign prostatic hyperplasia)   . Osteoarthritis of right knee    End Stage  . PMR (polymyalgia rheumatica) (HCC)     Past Surgical History:  Procedure Laterality Date  . APPENDECTOMY    . ELBOW SURGERY Left 2015  . MENISCECTOMY Right 1978?  . TOTAL KNEE ARTHROPLASTY Right 04/22/2018   Procedure: RIGHT TOTAL KNEE ARTHROPLASTY;  Surgeon: [name], MD;  Location: [place];  Service: Orthopedics;  Laterality: Right;    There were no vitals filed for this visit.  Subjective Assessment - 05/30/18 1103    Subjective  "I had a rough night last nigh" Pt reports doing a lot of walking yesterday    Currently in Pain?  No/denies    Pain Score  0-No pain                       [facility] Adult PT Treatment/Exercise - 05/30/18 0001      Ambulation/Gait   Stairs  Yes    Stair Management Technique  One rail Right;Alternating pattern    Number of Stairs  24    Height of Stairs  6    Gait Comments  RLE eccentric load weakness      Knee/Hip Exercises: Aerobic   Stationary Bike  partial revolutions x 4 min    Nustep  L3 x 5 min N0 UE        Knee/Hip Exercises: Machines for Strengthening   Cybex Knee Flexion  RLE 5lb 2x10     Cybex Leg Press  RLE 15lb 2x10     Total Gym Leg Press  30lb  2x15; TKE RLE 20lb 2x10       Knee/Hip Exercises: Standing   Walking with Sports Cord  40lb 3 way x3 each    Other Standing Knee Exercises  * in box taps RLE 2x10      Knee/Hip Exercises: Seated   Sit to Sand  2 sets;10 reps;without UE support      Modalities   Modalities  Vasopneumatic      Vasopneumatic   Number Minutes Vasopneumatic   15 minutes    Vasopnuematic Location   Knee    Vasopneumatic Pressure  Medium    Vasopneumatic Temperature   34      Manual Therapy   Manual Therapy  Passive ROM    Passive ROM  R knee flex and ext                PT Short Term Goals - 05/25/18 1148      PT SHORT TERM GOAL #1   Title  independent with initial HEP    Status  Achieved  PT Long Term Goals - 05/30/18 1109      PT LONG TERM GOAL #2   Title  decrease pain 50%    Status  Partially Met            Plan - 05/30/18 1144    Clinical Impression Statement  Pt able to progress to stair negotiation. He demos eccentric load weakness with the RLE descending stairs. Some hip compensation with to taps. Again some instability with resisted side step. Cues not to ambulate with stiff RLE with resisted gait.     Rehab Potential  Good    PT Frequency  3x / week    PT Duration  8 weeks    PT Treatment/Interventions  ADLs/Self Care Home Management;Cryotherapy;Electrical Stimulation;Therapeutic exercise;Therapeutic activities;Functional mobility training;Stair training;Gait training;Balance training;Neuromuscular re-education;Patient/family education;Manual techniques;Vasopneumatic Device    PT Next Visit Plan  add exercises and ROM, assure his gait is better       Patient will benefit from skilled therapeutic intervention in order to improve the following deficits and impairments:  Abnormal gait, Pain, Decreased scar mobility, Decreased range of motion, Decreased strength, Increased edema, Difficulty walking, Decreased balance  Visit Diagnosis: Difficulty in walking, not  elsewhere classified  Acute pain of right knee  Stiffness of right knee, not elsewhere classified  Localized edema     Problem List Patient Active Problem List   Diagnosis Date Noted  . New onset atrial fibrillation ([place])   . 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

## 2018-06-01 ENCOUNTER — Encounter: Payer: Self-pay | Admitting: Physical Therapy

## 2018-06-01 ENCOUNTER — Ambulatory Visit: Payer: PPO | Admitting: Physical Therapy

## 2018-06-01 ENCOUNTER — Ambulatory Visit (HOSPITAL_COMMUNITY): Payer: PPO | Admitting: Certified Registered Nurse Anesthetist

## 2018-06-01 ENCOUNTER — Other Ambulatory Visit: Payer: Self-pay

## 2018-06-01 ENCOUNTER — Ambulatory Visit (HOSPITAL_COMMUNITY)
Admission: RE | Admit: 2018-06-01 | Discharge: 2018-06-01 | Disposition: A | Discharge status: Home / Self Care | Payer: PPO | Attending: Internal Medicine | Admitting: Internal Medicine

## 2018-06-01 ENCOUNTER — Encounter (HOSPITAL_COMMUNITY)
Admission: RE | Disposition: A | Discharge status: Home / Self Care | Payer: Self-pay | Source: Home / Self Care | Attending: Internal Medicine

## 2018-06-01 DIAGNOSIS — R600 Localized edema: Secondary | ICD-10-CM

## 2018-06-01 DIAGNOSIS — M25561 Pain in right knee: Secondary | ICD-10-CM | POA: Diagnosis not present

## 2018-06-01 DIAGNOSIS — Z79899 Other long term (current) drug therapy: Secondary | ICD-10-CM | POA: Insufficient documentation

## 2018-06-01 DIAGNOSIS — Z87891 Personal history of nicotine dependence: Secondary | ICD-10-CM | POA: Insufficient documentation

## 2018-06-01 DIAGNOSIS — M1990 Unspecified osteoarthritis, unspecified site: Secondary | ICD-10-CM | POA: Diagnosis not present

## 2018-06-01 DIAGNOSIS — I4891 Unspecified atrial fibrillation: Secondary | ICD-10-CM | POA: Insufficient documentation

## 2018-06-01 DIAGNOSIS — I4819 Other persistent atrial fibrillation: Secondary | ICD-10-CM

## 2018-06-01 DIAGNOSIS — M353 Polymyalgia rheumatica: Secondary | ICD-10-CM | POA: Diagnosis not present

## 2018-06-01 DIAGNOSIS — Z880 Allergy status to penicillin: Secondary | ICD-10-CM | POA: Insufficient documentation

## 2018-06-01 DIAGNOSIS — Z7901 Long term (current) use of anticoagulants: Secondary | ICD-10-CM | POA: Diagnosis not present

## 2018-06-01 DIAGNOSIS — R262 Difficulty in walking, not elsewhere classified: Secondary | ICD-10-CM

## 2018-06-01 DIAGNOSIS — N400 Enlarged prostate without lower urinary tract symptoms: Secondary | ICD-10-CM | POA: Insufficient documentation

## 2018-06-01 DIAGNOSIS — M25661 Stiffness of right knee, not elsewhere classified: Secondary | ICD-10-CM

## 2018-06-01 HISTORY — PX: CARDIOVERSION: SHX1299

## 2018-06-01 SURGERY — CARDIOVERSION
Anesthesia: General

## 2018-06-01 MED ORDER — SODIUM CHLORIDE 0.9 % IV SOLN
INTRAVENOUS | Status: DC | PRN
  Administered 2018-06-01: 13:00:00 via INTRAVENOUS

## 2018-06-01 MED ORDER — LIDOCAINE HCL (CARDIAC) PF 100 MG/5ML IV SOSY
PREFILLED_SYRINGE | INTRAVENOUS | Status: DC | PRN
  Administered 2018-06-01: 60 mg via INTRAVENOUS

## 2018-06-01 MED ORDER — PROPOFOL 10 MG/ML IV BOLUS
INTRAVENOUS | Status: DC | PRN
  Administered 2018-06-01: 70 mg via INTRAVENOUS
  Administered 2018-06-01: 40 mg via INTRAVENOUS
  Administered 2018-06-01: 30 mg via INTRAVENOUS

## 2018-06-01 NOTE — Transfer of Care (Signed)
Immediate Anesthesia Transfer of Care Note  Patient: Frank Hudson  Procedure(s) Performed: CARDIOVERSION (N/A )  Patient Location: Endoscopy Unit  Anesthesia Type:General  Level of Consciousness: awake, alert , oriented, patient cooperative and responds to stimulation  Airway & Oxygen Therapy: Patient Spontanous Breathing  Post-op Assessment: Report given to RN, Post -op Vital signs reviewed and stable and Patient moving all extremities X 4  Post vital signs: Reviewed and stable  Last Vitals:  Vitals Value Taken Time  BP    Temp    Pulse    Resp    SpO2      Last Pain:  Vitals:   06/01/18 1244  TempSrc: Oral  PainSc:          Complications: No apparent anesthesia complications

## 2018-06-01 NOTE — Discharge Instructions (Signed)
Electrical Cardioversion, Care After °This sheet gives you information about how to care for yourself after your procedure. Your health care provider may also give you more specific instructions. If you have problems or questions, contact your health care provider. °What can I expect after the procedure? °After the procedure, it is common to have: °· Some redness on the skin where the shocks were given. °Follow these instructions at home: ° °· Do not drive for 24 hours if you were given a medicine to help you relax (sedative). °· Take over-the-counter and prescription medicines only as told by your health care provider. °· Ask your health care provider how to check your pulse. Check it often. °· Rest for 48 hours after the procedure or as told by your health care provider. °· Avoid or limit your caffeine use as told by your health care provider. °Contact a health care provider if: °· You feel like your heart is beating too quickly or your pulse is not regular. °· You have a serious muscle cramp that does not go away. °Get help right away if: ° °· You have discomfort in your chest. °· You are dizzy or you feel faint. °· You have trouble breathing or you are short of breath. °· Your speech is slurred. °· You have trouble moving an arm or leg on one side of your body. °· Your fingers or toes turn cold or blue. °This information is not intended to replace advice given to you by your health care provider. Make sure you discuss any questions you have with your health care provider. °Document Released: 02/22/2013 Document Revised: 12/06/2015 Document Reviewed: 11/08/2015 °Elsevier Interactive Patient Education © 2019 [employer] ° °

## 2018-06-01 NOTE — Therapy (Signed)
[place], [place], [address] Phone: [phone]   Fax:  [phone]  Physical Therapy Treatment  Patient Details  Name: Frank Hudson MRN: [id number] Date of Birth: November 30, 1950 Referring Provider (PT): Carlynn Spry Date: 06/01/2018  PT End of Session - 06/01/18 1003    Visit Number  5    Date for PT Re-Evaluation  07/18/18    PT Start Time  0927    PT Stop Time  1018    PT Time Calculation (min)  51 min    Activity Tolerance  Patient tolerated treatment well    Behavior During Therapy  [facility] for tasks assessed/performed       Past Medical History:  Diagnosis Date  . Arthritis   . BPH (benign prostatic hyperplasia)   . Osteoarthritis of right knee    End Stage  . PMR (polymyalgia rheumatica) (HCC)     Past Surgical History:  Procedure Laterality Date  . APPENDECTOMY    . ELBOW SURGERY Left 2015  . MENISCECTOMY Right 1978?  . TOTAL KNEE ARTHROPLASTY Right 04/22/2018   Procedure: RIGHT TOTAL KNEE ARTHROPLASTY;  Surgeon: [name], MD;  Location: [facility];  Service: Orthopedics;  Laterality: Right;    There were no vitals filed for this visit.  Subjective Assessment - 06/01/18 0930    Subjective  "I have that procedure today" Pt reports that he has ben fasting due to this procedure    Currently in Pain?  Yes    Pain Score  0-No pain                       [facility] Adult PT Treatment/Exercise - 06/01/18 0001      Knee/Hip Exercises: Stretches   Passive Hamstring Stretch  Right;10 seconds;5 reps      Knee/Hip Exercises: Aerobic   Stationary Bike  partial revolutions x 5 min    Nustep  L3 x 5 min N0 UE        Knee/Hip Exercises: Machines for Strengthening   Cybex Knee Flexion  RLE 5lb 2x10     Cybex Leg Press  RLE 15lb 2x10     Total Gym Leg Press  40lb 2x15; TKE RLE 20lb 2x10       Knee/Hip Exercises: Standing   Forward Step Up  Right;2 sets;10 reps;Hand  Hold: 0;Step Height: 8"    Walking with Sports Cord  40lb 3 way x3 each    Other Standing Knee Exercises  8 in box taps RLE 2x10      Knee/Hip Exercises: Seated   Sit to Sand  2 sets;10 reps;without UE support      Modalities   Modalities  Vasopneumatic      Vasopneumatic   Number Minutes Vasopneumatic   15 minutes    Vasopnuematic Location   Knee    Vasopneumatic Pressure  Medium    Vasopneumatic Temperature   34      Manual Therapy   Manual Therapy  Passive ROM    Passive ROM  R knee flex and ext                PT Short Term Goals - 05/25/18 1148      PT SHORT TERM GOAL #1   Title  independent with initial HEP    Status  Achieved        PT Long Term Goals - 05/30/18 1109  PT LONG TERM GOAL #2   Title  decrease pain 50%    Status  Partially Met            Plan - 06/01/18 1006    Clinical Impression Statement  Pt has a heart procedure today and was not able to eat before therapy session so exercises were kept to a minium and given frequent rest. He did well with the exercises but does lack full R knee extension. Hs are very tight. Pain at the end range of both flexion and extension.     Rehab Potential  Good    PT Frequency  3x / week    PT Duration  8 weeks    PT Treatment/Interventions  ADLs/Self Care Home Management;Cryotherapy;Electrical Stimulation;Therapeutic exercise;Therapeutic activities;Functional mobility training;Stair training;Gait training;Balance training;Neuromuscular re-education;Patient/family education;Manual techniques;Vasopneumatic Device    PT Next Visit Plan  add exercises and ROM, assure his gait is better       Patient will benefit from skilled therapeutic intervention in order to improve the following deficits and impairments:  Abnormal gait, Pain, Decreased scar mobility, Decreased range of motion, Decreased strength, Increased edema, Difficulty walking, Decreased balance  Visit Diagnosis: Difficulty in walking, not  elsewhere classified  Acute pain of right knee  Stiffness of right knee, not elsewhere classified  Localized edema     Problem List Patient Active Problem List   Diagnosis Date Noted  . New onset atrial fibrillation ([place])   . 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

## 2018-06-01 NOTE — Anesthesia Preprocedure Evaluation (Signed)
Anesthesia Evaluation  Patient identified by MRN, date of birth, ID band Patient awake    Reviewed: Allergy & Precautions, NPO status , Patient's Chart, lab work & pertinent test results  Airway Mallampati: II  TM Distance: >3 FB     Dental   Pulmonary former smoker,    breath sounds clear to auscultation       Cardiovascular negative cardio ROS   Rhythm:Regular Rate:Normal     Neuro/Psych    GI/Hepatic Neg liver ROS, History noted CG   Endo/Other    Renal/GU negative Renal ROS     Musculoskeletal   Abdominal   Peds  Hematology   Anesthesia Other Findings   Reproductive/Obstetrics                             Anesthesia Physical Anesthesia Plan  ASA: III  Anesthesia Plan: General   Post-op Pain Management:  Regional for Post-op pain   Induction: Intravenous  PONV Risk Score and Plan: Treatment may vary due to age or medical condition, Ondansetron, Dexamethasone and Midazolam  Airway Management Planned: Oral ETT  Additional Equipment:   Intra-op Plan:   Post-operative Plan: Extubation in OR  Informed Consent: I have reviewed the patients History and Physical, chart, labs and discussed the procedure including the risks, benefits and alternatives for the proposed anesthesia with the patient or authorized representative who has indicated his/her understanding and acceptance.     Dental advisory given  Plan Discussed with: CRNA, Anesthesiologist and Surgeon  Anesthesia Plan Comments:         Anesthesia Quick Evaluation

## 2018-06-01 NOTE — H&P (Signed)
   INTERVAL PROCEDURE H&P  History and Physical Interval Note:  06/01/2018 12:23 PM  Frank Hudson has presented today for their planned procedure. The various methods of treatment have been discussed with the patient and family. After consideration of risks, benefits and other options for treatment, the patient has consented to the procedure.  The patients' outpatient history has been reviewed, patient examined, and no change in status from most recent office note within the past 30 days. I have reviewed the patients' chart and labs and will proceed as planned. Questions were answered to the patient's satisfaction.   [name], MD, [facility], [place] Director of the Advanced Lipid Disorders &  Cardiovascular Risk Reduction Clinic Diplomate of the American Board of Clinical Lipidology Attending Cardiologist  Direct Dial: [phone]  Fax: [phone]  Website:  [email or web address] 06/01/2018, 12:23 PM

## 2018-06-01 NOTE — Anesthesia Postprocedure Evaluation (Signed)
Anesthesia Post Note  Patient: Frank Hudson  Procedure(s) Performed: CARDIOVERSION (N/A )     Patient location during evaluation: PACU Anesthesia Type: General Level of consciousness: awake Pain management: pain level controlled Vital Signs Assessment: post-procedure vital signs reviewed and stable Respiratory status: spontaneous breathing Cardiovascular status: stable Postop Assessment: no apparent nausea or vomiting Anesthetic complications: no    Last Vitals:  Vitals:   06/01/18 1338 06/01/18 1350  BP: 118/90 122/65  Pulse: 84 80  Resp: 15 15  Temp: 37.2 C   SpO2: 100% 99%    Last Pain:  Vitals:   06/01/18 1350  TempSrc:   PainSc: 0-No pain                 Jini Horiuchi

## 2018-06-01 NOTE — CV Procedure (Signed)
   CARDIOVERSION NOTE  Procedure: Electrical Cardioversion Indications:  Atrial Fibrillation  Procedure Details:  Consent: Risks of procedure as well as the alternatives and risks of each were explained to the (patient/caregiver).  Consent for procedure obtained.  Time Out: Verified patient identification, verified procedure, site/side was marked, verified correct patient position, special equipment/implants available, medications/allergies/relevent history reviewed, required imaging and test results available.  Performed  Patient placed on cardiac monitor, pulse oximetry, supplemental oxygen as necessary.  Sedation given: Propofol per anesthesia Pacer pads placed anterior and posterior chest.  Cardioverted 2 time(s).  Cardioverted at 150J and 200J biphasic.  Impression: Findings: Post procedure EKG shows: Atrial Fibrillation Complications: None Patient did tolerate procedure well.  Plan: 1. Unsuccessful DCCV - despite a long 4-5 second pause after the second attempt, there were absolutely no sinus beats or evidence for anything except afib. 2. Suspect afib may have been more chronic - since he is asymptomatic, would likely push for rate-control and anticoagulation vs. Rhythm control. 3. Await upcoming echo to review LA size - will follow-up in the office in February.  Time Spent Directly with the Patient:  45 minutes   [name], MD, [facility], [place] Director of the Advanced Lipid Disorders &  Cardiovascular Risk Reduction Clinic Diplomate of the American Board of Clinical Lipidology Attending Cardiologist  Direct Dial: [phone]  Fax: [phone]  Website:  [email or web address] Priti Consoli 06/01/2018, 1:51 PM

## 2018-06-03 ENCOUNTER — Encounter: Payer: Self-pay | Admitting: Physical Therapy

## 2018-06-03 ENCOUNTER — Ambulatory Visit: Payer: PPO | Admitting: Physical Therapy

## 2018-06-03 DIAGNOSIS — R262 Difficulty in walking, not elsewhere classified: Secondary | ICD-10-CM

## 2018-06-03 DIAGNOSIS — M25661 Stiffness of right knee, not elsewhere classified: Secondary | ICD-10-CM

## 2018-06-03 DIAGNOSIS — M25561 Pain in right knee: Secondary | ICD-10-CM

## 2018-06-03 DIAGNOSIS — R600 Localized edema: Secondary | ICD-10-CM

## 2018-06-03 NOTE — Therapy (Signed)
[place], [place], [address] Phone: [phone]   Fax:  [phone]  Physical Therapy Treatment  Patient Details  Name: Frank Hudson MRN: [id number] Date of Birth: [birth year] Referring Provider (PT): Carlynn Spry Date: 06/03/2018  PT End of Session - 06/03/18 1152    Visit Number  6    Date for PT Re-Evaluation  07/18/18    PT Start Time  1100    PT Stop Time  1202    PT Time Calculation (min)  62 min    Activity Tolerance  Patient tolerated treatment well    Behavior During Therapy  [facility] for tasks assessed/performed       Past Medical History:  Diagnosis Date  . Arthritis   . BPH (benign prostatic hyperplasia)   . Osteoarthritis of right knee    End Stage  . PMR (polymyalgia rheumatica) (HCC)     Past Surgical History:  Procedure Laterality Date  . APPENDECTOMY    . CARDIOVERSION N/A 06/01/2018   Procedure: CARDIOVERSION;  Surgeon: [name], MD;  Location: [facility];  Service: Cardiovascular;  Laterality: N/A;  . ELBOW SURGERY Left 2015  . MENISCECTOMY Right 1978?  . TOTAL KNEE ARTHROPLASTY Right 04/22/2018   Procedure: RIGHT TOTAL KNEE ARTHROPLASTY;  Surgeon: [name], MD;  Location: [facility];  Service: Orthopedics;  Laterality: Right;    There were no vitals filed for this visit.  Subjective Assessment - 06/03/18 1103    Subjective  "I am feeling ok" Pt reports pain last night 5/10 making it difficult to sleep    Currently in Pain?  No/denies         [facility] PT Assessment - 06/03/18 0001      AROM   AROM Assessment Site  Knee    Right/Left Knee  Right    Right Knee Extension  9    Right Knee Flexion  100      PROM   PROM Assessment Site  Knee    Right/Left Knee  Right    Right Knee Flexion  110      Ambulation/Gait   Stairs  Yes    Stairs Assistance  5: Supervision;6: Modified independent (Device/Increase time)    Stair Management Technique   No rails;One rail Right;Alternating pattern    Number of Stairs  48    Height of Stairs  6    Gait Comments  RLE eccentric load weakness                   [facility] Adult PT Treatment/Exercise - 06/03/18 0001      Knee/Hip Exercises: Aerobic   Stationary Bike  partial revolutions x 4 min    Nustep  L3 x 6 min N0 UE        Knee/Hip Exercises: Machines for Strengthening   Cybex Knee Flexion  RLE 5lb 2x10     Cybex Leg Press  RLE 20lb 2x10     Total Gym Leg Press  50lb 2x15;  RLE 20lb 2x10       Knee/Hip Exercises: Standing   Lateral Step Up  Right;2 sets;10 reps;Step Height: 6";Hand Hold: 0      Knee/Hip Exercises: Seated   Sit to Sand  2 sets;10 reps;without UE support   holding yellow ball      Modalities   Modalities  Vasopneumatic      Vasopneumatic   Vasopnuematic Location   Knee  Vasopneumatic Pressure  Medium    Vasopneumatic Temperature   34      Manual Therapy   Manual Therapy  Passive ROM;Joint mobilization    Manual therapy comments  Some PROM taken to end range and held    Joint Mobilization  Patella mobes, Tibiofemoral JT mobes for ext graded 2-3    Passive ROM  R knee flex and ext                PT Short Term Goals - 05/25/18 1148      PT SHORT TERM GOAL #1   Title  independent with initial HEP    Status  Achieved        PT Long Term Goals - 06/03/18 1153      PT LONG TERM GOAL #1   Title  ascend and descend stairs step over step    Status  Partially Met      PT LONG TERM GOAL #2   Title  decrease pain 50%    Status  Partially Met      PT LONG TERM GOAL #3   Title  increase AROM to 5-115 degrees flexion    Status  On-going      PT LONG TERM GOAL #4   Title  return safely to the gym    Status  On-going            Plan - 06/03/18 1153    Clinical Impression Statement  Pt is doing well and is progressing towards goals. Progressed to stair negotiation alternating pattern without rails. He does have some eccentric  load weakness in RLE descending stairs. R knee AROM has improved but remains limited more so with extension. Cues to push through RLE with lateral step ups. Positive response to MT. HS remains very tight.     Rehab Potential  Good    PT Frequency  3x / week    PT Duration  8 weeks    PT Treatment/Interventions  ADLs/Self Care Home Management;Cryotherapy;Electrical Stimulation;Therapeutic exercise;Therapeutic activities;Functional mobility training;Stair training;Gait training;Balance training;Neuromuscular re-education;Patient/family education;Manual techniques;Vasopneumatic Device    PT Next Visit Plan  Functional and eccentric load strengthening. Continues to stretch HS       Patient will benefit from skilled therapeutic intervention in order to improve the following deficits and impairments:  Abnormal gait, Pain, Decreased scar mobility, Decreased range of motion, Decreased strength, Increased edema, Difficulty walking, Decreased balance  Visit Diagnosis: Difficulty in walking, not elsewhere classified  Acute pain of right knee  Stiffness of right knee, not elsewhere classified  Localized edema     Problem List Patient Active Problem List   Diagnosis Date Noted  . Persistent atrial fibrillation   . 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

## 2018-06-06 ENCOUNTER — Ambulatory Visit: Payer: PPO | Admitting: Physical Therapy

## 2018-06-06 ENCOUNTER — Encounter: Payer: Self-pay | Admitting: Physical Therapy

## 2018-06-06 DIAGNOSIS — M25561 Pain in right knee: Secondary | ICD-10-CM | POA: Diagnosis not present

## 2018-06-06 DIAGNOSIS — R600 Localized edema: Secondary | ICD-10-CM

## 2018-06-06 DIAGNOSIS — R262 Difficulty in walking, not elsewhere classified: Secondary | ICD-10-CM

## 2018-06-06 DIAGNOSIS — M25661 Stiffness of right knee, not elsewhere classified: Secondary | ICD-10-CM

## 2018-06-06 NOTE — Therapy (Signed)
[place], [place], [address] Phone: [phone]   Fax:  [phone]  Physical Therapy Treatment  Patient Details  Name: Frank Hudson MRN: [id number] Date of Birth: October 20, 1950 Referring Provider (PT): Carlynn Spry Date: 06/06/2018  PT End of Session - 06/06/18 1135    Visit Number  7    Date for PT Re-Evaluation  07/18/18    PT Start Time  1100    PT Stop Time  1150    PT Time Calculation (min)  50 min    Activity Tolerance  Patient tolerated treatment well    Behavior During Therapy  [facility] for tasks assessed/performed       Past Medical History:  Diagnosis Date  . Arthritis   . BPH (benign prostatic hyperplasia)   . Osteoarthritis of right knee    End Stage  . PMR (polymyalgia rheumatica) (HCC)     Past Surgical History:  Procedure Laterality Date  . APPENDECTOMY    . CARDIOVERSION N/A 06/01/2018   Procedure: CARDIOVERSION;  Surgeon: [name], MD;  Location: [facility];  Service: Cardiovascular;  Laterality: N/A;  . ELBOW SURGERY Left 2015  . MENISCECTOMY Right 1978?  . TOTAL KNEE ARTHROPLASTY Right 04/22/2018   Procedure: RIGHT TOTAL KNEE ARTHROPLASTY;  Surgeon: [name], MD;  Location: [facility];  Service: Orthopedics;  Laterality: Right;    There were no vitals filed for this visit.  Subjective Assessment - 06/06/18 1103    Subjective  "I am feeling ok"    Currently in Pain?  Yes    Pain Score  3     Pain Location  Knee    Pain Orientation  Right         [facility] PT Assessment - 06/06/18 0001      AROM   AROM Assessment Site  Knee    Right/Left Knee  Right    Right Knee Extension  9    Right Knee Flexion  98      Strength   Strength Assessment Site  Knee    Right/Left Knee  Right    Right Knee Flexion  4/5    Right Knee Extension  5/5                   [facility] Adult PT Treatment/Exercise - 06/06/18 0001      Ambulation/Gait   Stairs   Yes    Stairs Assistance  5: Supervision;6: Modified independent (Device/Increase time)    Stair Management Technique  No rails;One rail Right;Alternating pattern    Number of Stairs  72    Height of Stairs  6    Gait Comments  RLE eccentric load weakness      Knee/Hip Exercises: Stretches   Passive Hamstring Stretch  Right;10 seconds;5 reps      Knee/Hip Exercises: Aerobic   Nustep  L3 x 5 min N0 UE        Knee/Hip Exercises: Machines for Strengthening   Cybex Knee Flexion  RLE 5lb 2x10     Cybex Leg Press  RLE 20lb 2x10       Knee/Hip Exercises: Standing   Lateral Step Up  Right;2 sets;10 reps;Step Height: 6";Hand Hold: 0    Forward Step Up  Right;2 sets;10 reps;Hand Hold: 0;Step Height: 8"      Modalities   Modalities  Vasopneumatic      Vasopneumatic   Number Minutes Vasopneumatic   15  minutes    Vasopnuematic Location   Knee    Vasopneumatic Pressure  Medium    Vasopneumatic Temperature   34      Manual Therapy   Manual Therapy  Passive ROM;Joint mobilization    Manual therapy comments  Some PROM taken to end range and held    Joint Mobilization  Patella mobes, Tibiofemoral JT mobes for ext graded 2-3    Passive ROM  R knee flex and ext                PT Short Term Goals - 05/25/18 1148      PT SHORT TERM GOAL #1   Title  independent with initial HEP    Status  Achieved        PT Long Term Goals - 06/03/18 1153      PT LONG TERM GOAL #1   Title  ascend and descend stairs step over step    Status  Partially Met      PT LONG TERM GOAL #2   Title  decrease pain 50%    Status  Partially Met      PT LONG TERM GOAL #3   Title  increase AROM to 5-115 degrees flexion    Status  On-going      PT LONG TERM GOAL #4   Title  return safely to the gym    Status  On-going            Plan - 06/06/18 1136    Clinical Impression Statement  Overall pt is progressing well functionally. Again some eccentric load weakness in R quad when descending  stairs. Pt is still lacking some R knee flexion and extension. BIlat HS are tight. Pt strength is good for the available ROM. Pain reported at end range of MT.     Rehab Potential  Good    PT Treatment/Interventions  ADLs/Self Care Home Management;Cryotherapy;Electrical Stimulation;Therapeutic exercise;Therapeutic activities;Functional mobility training;Stair training;Gait training;Balance training;Neuromuscular re-education;Patient/family education;Manual techniques;Vasopneumatic Device    PT Next Visit Plan  Functional and eccentric load strengthening. Continues to stretch HS       Patient will benefit from skilled therapeutic intervention in order to improve the following deficits and impairments:  Abnormal gait, Pain, Decreased scar mobility, Decreased range of motion, Decreased strength, Increased edema, Difficulty walking, Decreased balance  Visit Diagnosis: Difficulty in walking, not elsewhere classified  Stiffness of right knee, not elsewhere classified  Localized edema  Acute pain of right knee     Problem List Patient Active Problem List   Diagnosis Date Noted  . Persistent atrial fibrillation   . 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

## 2018-06-07 ENCOUNTER — Telehealth: Payer: Self-pay

## 2018-06-07 ENCOUNTER — Telehealth: Payer: Self-pay | Admitting: Internal Medicine

## 2018-06-07 NOTE — Telephone Encounter (Signed)
Received Medical Clearance form from [facility] put in Dr.Hilty's box for completion.

## 2018-06-07 NOTE — Telephone Encounter (Signed)
New Message   1. What dental office are you calling from? [facility]  2. What is your office phone number? [phone]  3. What is your fax number? [phone]  4. What type of procedure is the patient having performed? Any Procedure, cleanings, fillings etc.  5. What date is procedure scheduled or is the patient there now? TBD needing clearance before they schedule upcomming dental work (if the patient is at the dentist's office question goes to their cardiologist if he/she is in the office.  If not, question should go to the DOD).   6. What is your question (ex. Antibiotics prior to procedure, holding medication-we need to know how long dentist wants pt to hold med)? Blood thinner medication  [facility] is also sending a fax

## 2018-06-08 ENCOUNTER — Ambulatory Visit: Payer: PPO | Admitting: Physical Therapy

## 2018-06-08 ENCOUNTER — Telehealth: Payer: Self-pay | Admitting: Internal Medicine

## 2018-06-08 NOTE — Telephone Encounter (Signed)
   [facility] Pre-operative Risk Assessment    Request for surgical clearance:  1. What type of surgery is being performed? Closed manipulation of knee   2. When is this surgery scheduled? TBD - needs to be in next 1-2 weeks per ortho   3. What type of clearance is required (medical clearance vs. Pharmacy clearance to hold med vs. Both)? Both   4. Are there any medications that need to be held prior to surgery and how long? eliquis - 2 days prior   5. Practice name and name of physician performing surgery? Dr. Veverly Fells @ [facility]   6. What is your office phone number  [phone]   7.   What is your office fax number [phone]  8.   Anesthesia type (None, local, MAC, general) ? General    Frank Hudson M 06/08/2018, 10:51 AM  _________________________________________________________________   (provider comments below)

## 2018-06-08 NOTE — Telephone Encounter (Signed)
Spoke with Brad @ [facility].   Dr. Veverly Fells needs to do a closed manipulation of knee (had surgery about 6-8 weeks). This needs to be done in next 1-2 weeks. Patient has AF and failed cardioversion. He has echo scheduled but they are requesting this be moved up sooner so patient can be cleared for procedure. Patient will also need to hold eliquis 2 days prior to procedure. Patient will be under general anesthesia for procedure.   Notified Brad of our pre-op process and that I will send a staff message to our scheduler to check on earlier echo appointment and route clearance request to our pre-op team to address.   Phone: [phone] Fax: [phone]

## 2018-06-08 NOTE — Telephone Encounter (Signed)
   [facility] Pre-operative Risk Assessment    Request for surgical clearance:  1. What type of surgery is being performed? Unspecified dental care   2. When is this surgery scheduled? TBD   3. What type of clearance is required (medical clearance vs. Pharmacy clearance to hold med vs. Both)? Not specified  4. Are there any medications that need to be held prior to surgery and how long? Not specified - on eliquis    5. Practice name and name of physician performing surgery? [facility]   6. What is your office phone number [phone]    7.   What is your office fax number [phone]  8.   Anesthesia type (None, local, MAC, general) ? Not specified  Additional questions (noted in red if need to be answered)  -- date of last exam: 05/20/2018  -- list of all current medications:    eliquis 67m BID   dutasteride 0.544mQOD   metoprolol tartrate 12.16m71mID   temazepam 31m40mS PRN   percocet Q4H PRN  -- known medical conditions: polymyalgia rheumatica, BPH, Afib  -- drug allergies: amoxicillin   -- does patient require pre-medications prior to dental treatment?  -- has patient been prescribed blood thinner - YES, eliquis  -- may they undergo general dentistry while taking the blood thinner?  -- how many days does patient need to be off blood thinner prior to dental extractions?  -- how many days does patient need to be off blood thinner prior to deep cleaning procedure?  -- what type of pain meds can dentist office prescribe?  -- what type of antibiotics can dental office prescribe?   Frank Hudson/22/2020, 7:54 AM  _________________________________________________________________   (provider comments below)

## 2018-06-09 NOTE — Telephone Encounter (Signed)
   Primary Cardiologist: Dr [name]  Chart reviewed as part of pre-operative protocol coverage. Simple dental extractions are considered low risk procedures per guidelines and generally do not require any specific cardiac clearance. It is also generally accepted that for simple extractions and dental cleanings, there is no need to interrupt blood thinner therapy.   SBE prophylaxis is/is not required for the patient.  I will route this recommendation to the requesting party via Epic fax function and remove from pre-op pool.  Please call with questions.  Kerin Ransom, PA-C 06/09/2018, 3:16 PM

## 2018-06-10 ENCOUNTER — Encounter: Payer: Self-pay | Admitting: Physical Therapy

## 2018-06-10 ENCOUNTER — Ambulatory Visit (HOSPITAL_COMMUNITY): Payer: PPO | Attending: Cardiovascular Disease

## 2018-06-10 ENCOUNTER — Ambulatory Visit: Payer: PPO | Admitting: Physical Therapy

## 2018-06-10 ENCOUNTER — Other Ambulatory Visit: Payer: Self-pay

## 2018-06-10 DIAGNOSIS — M25661 Stiffness of right knee, not elsewhere classified: Secondary | ICD-10-CM

## 2018-06-10 DIAGNOSIS — M25561 Pain in right knee: Secondary | ICD-10-CM | POA: Diagnosis not present

## 2018-06-10 DIAGNOSIS — I4891 Unspecified atrial fibrillation: Secondary | ICD-10-CM | POA: Diagnosis not present

## 2018-06-10 DIAGNOSIS — R600 Localized edema: Secondary | ICD-10-CM

## 2018-06-10 DIAGNOSIS — R262 Difficulty in walking, not elsewhere classified: Secondary | ICD-10-CM

## 2018-06-10 NOTE — Telephone Encounter (Signed)
   Primary Cardiologist: Dr [name]  Chart reviewed as part of pre-operative protocol coverage. Given past medical history and time since last visit, based on ACC/AHA guidelines, Frank Hudson would be at acceptable risk for the planned procedure without further cardiovascular testing.   OK to reschedule echo if needed. OK to hold Eliquis 2 days pre op knee manipulation if needed.   I will route this recommendation to the requesting party via Epic fax function and remove from pre-op pool.  Please call with questions.  Kerin Ransom, PA-C 06/10/2018, 1:09 PM

## 2018-06-10 NOTE — Therapy (Signed)
[place], [place], [address] Phone: [phone]   Fax:  [phone]  Physical Therapy Treatment  Patient Details  Name: Frank Hudson MRN: [id number] Date of Birth: [birth year] Referring Provider (PT): Carlynn Spry Date: 06/10/2018  PT End of Session - 06/10/18 1150    Visit Number  8    Date for PT Re-Evaluation  07/18/18    PT Start Time  1100    PT Stop Time  1203    PT Time Calculation (min)  63 min    Activity Tolerance  Patient tolerated treatment well    Behavior During Therapy  [facility] for tasks assessed/performed       Past Medical History:  Diagnosis Date  . Arthritis   . BPH (benign prostatic hyperplasia)   . Osteoarthritis of right knee    End Stage  . PMR (polymyalgia rheumatica) (HCC)     Past Surgical History:  Procedure Laterality Date  . APPENDECTOMY    . CARDIOVERSION N/A 06/01/2018   Procedure: CARDIOVERSION;  Surgeon: [name], MD;  Location: [place];  Service: Cardiovascular;  Laterality: N/A;  . ELBOW SURGERY Left 2015  . MENISCECTOMY Right 1978?  . TOTAL KNEE ARTHROPLASTY Right 04/22/2018   Procedure: RIGHT TOTAL KNEE ARTHROPLASTY;  Surgeon: [name], MD;  Location: [place];  Service: Orthopedics;  Laterality: Right;    There were no vitals filed for this visit.  Subjective Assessment - 06/10/18 1102    Subjective  "Pretty good, ok" Pt reports going to the MD last week, MD mentioned a possible manipulation.    Currently in Pain?  Yes    Pain Score  3     Pain Location  Knee    Pain Orientation  Right                       [facility] Adult PT Treatment/Exercise - 06/10/18 0001      Knee/Hip Exercises: Aerobic   Stationary Bike  seat 8 full revs x4 min    Elliptical  I5 R3 2 in each way    Nustep  L3 x 5 min N0 UE        Knee/Hip Exercises: Machines for Strengthening   Cybex Knee Flexion  RLE 10lb 2x10    R knee  stretching after sets.   Cybex Leg Press  RLE 20lb 2x10     Total Gym Leg Press  30lb 2x15 Low position;  low position RLE 20lb 2x5      Knee/Hip Exercises: Standing   Lateral Step Up  Right;10 reps;Hand Hold: 0;Step Height: 8";1 set    Forward Step Up  Right;10 reps;Hand Hold: 0;Step Height: 8";1 set      Modalities   Modalities  Vasopneumatic      Vasopneumatic   Number Minutes Vasopneumatic   15 minutes    Vasopnuematic Location   Knee    Vasopneumatic Pressure  Medium    Vasopneumatic Temperature   34      Manual Therapy   Manual Therapy  Passive ROM;Joint mobilization    Manual therapy comments  Some PROM taken to end range and held    Joint Mobilization  Patella mobes, Tibiofemoral JT mobes for ext graded 2-3,scar mobilizations    Passive ROM  R knee flex and ext                PT Short Term Goals -  05/25/18 1148      PT SHORT TERM GOAL #1   Title  independent with initial HEP    Status  Achieved        PT Long Term Goals - 06/03/18 1153      PT LONG TERM GOAL #1   Title  ascend and descend stairs step over step    Status  Partially Met      PT LONG TERM GOAL #2   Title  decrease pain 50%    Status  Partially Met      PT LONG TERM GOAL #3   Title  increase AROM to 5-115 degrees flexion    Status  On-going      PT LONG TERM GOAL #4   Title  return safely to the gym    Status  On-going            Plan - 06/10/18 1151    Clinical Impression Statement  Good strength with step ups without compensation. Focused on RLE isolation strengthen and stretching. 100 deg of R knee flexion noted after exercises and stretching. Some assist needed with SL on leg press starting from lower position.     Rehab Potential  Good    PT Frequency  3x / week    PT Duration  8 weeks    PT Treatment/Interventions  ADLs/Self Care Home Management;Cryotherapy;Electrical Stimulation;Therapeutic exercise;Therapeutic activities;Functional mobility training;Stair  training;Gait training;Balance training;Neuromuscular re-education;Patient/family education;Manual techniques;Vasopneumatic Device    PT Next Visit Plan  Functional and eccentric load strengthening. Continues to stretch HS       Patient will benefit from skilled therapeutic intervention in order to improve the following deficits and impairments:  Abnormal gait, Pain, Decreased scar mobility, Decreased range of motion, Decreased strength, Increased edema, Difficulty walking, Decreased balance  Visit Diagnosis: Difficulty in walking, not elsewhere classified  Stiffness of right knee, not elsewhere classified  Localized edema  Acute pain of right knee     Problem List Patient Active Problem List   Diagnosis Date Noted  . Persistent atrial fibrillation   . 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

## 2018-06-13 ENCOUNTER — Encounter: Payer: Self-pay | Admitting: Physical Therapy

## 2018-06-13 ENCOUNTER — Ambulatory Visit: Payer: PPO | Admitting: Physical Therapy

## 2018-06-13 DIAGNOSIS — R600 Localized edema: Secondary | ICD-10-CM

## 2018-06-13 DIAGNOSIS — R262 Difficulty in walking, not elsewhere classified: Secondary | ICD-10-CM

## 2018-06-13 DIAGNOSIS — M25561 Pain in right knee: Secondary | ICD-10-CM

## 2018-06-13 DIAGNOSIS — M25661 Stiffness of right knee, not elsewhere classified: Secondary | ICD-10-CM

## 2018-06-13 NOTE — Therapy (Signed)
[place], [place], [address] Phone: [phone]   Fax:  [phone]  Physical Therapy Treatment  Patient Details  Name: Frank Hudson MRN: [id number] Date of Birth: January 21, 1951 Referring Provider (PT): Carlynn Spry Date: 06/13/2018  PT End of Session - 06/13/18 1146    Visit Number  9    Date for PT Re-Evaluation  07/18/18    PT Start Time  1100    PT Stop Time  1159    PT Time Calculation (min)  59 min       Past Medical History:  Diagnosis Date  . Arthritis   . BPH (benign prostatic hyperplasia)   . Osteoarthritis of right knee    End Stage  . PMR (polymyalgia rheumatica) (HCC)     Past Surgical History:  Procedure Laterality Date  . APPENDECTOMY    . CARDIOVERSION N/A 06/01/2018   Procedure: CARDIOVERSION;  Surgeon: [name], MD;  Location: [facility];  Service: Cardiovascular;  Laterality: N/A;  . ELBOW SURGERY Left 2015  . MENISCECTOMY Right 1978?  . TOTAL KNEE ARTHROPLASTY Right 04/22/2018   Procedure: RIGHT TOTAL KNEE ARTHROPLASTY;  Surgeon: [name], MD;  Location: [place];  Service: Orthopedics;  Laterality: Right;    There were no vitals filed for this visit.  Subjective Assessment - 06/13/18 1101    Subjective  "The same"    Currently in Pain?  Yes    Pain Location  Knee    Pain Orientation  Right                       [facility] Adult PT Treatment/Exercise - 06/13/18 0001      Knee/Hip Exercises: Aerobic   Stationary Bike  seat 8 full revs x5 min      Knee/Hip Exercises: Machines for Strengthening   Cybex Knee Flexion  RLE 20lb eccentrics 2x10, 10lb RLE 2x10    Cybex Leg Press  RLE 20lb 2x10     Total Gym Leg Press  RLE 20lb 4x5      Knee/Hip Exercises: Standing   Lateral Step Up  Right;10 reps;Hand Hold: 0;Step Height: 6";2 sets   6 lb cuff RLE    Forward Step Up  Right;10 reps;Hand Hold: 0;Step Height: 8";2 sets   _0  cuff  RLE   Walking with Sports Cord  50lb 3 way x3 each    Other Standing Knee Exercises  RLE marches 2lb 2x15     Other Standing Knee Exercises  Controlled descents 6in, 4 in x10 each       Modalities   Modalities  Vasopneumatic      Vasopneumatic   Number Minutes Vasopneumatic   15 minutes    Vasopnuematic Location   Knee    Vasopneumatic Pressure  Medium    Vasopneumatic Temperature   34               PT Short Term Goals - 05/25/18 1148      PT SHORT TERM GOAL #1   Title  independent with initial HEP    Status  Achieved        PT Long Term Goals - 06/03/18 1153      PT LONG TERM GOAL #1   Title  ascend and descend stairs step over step    Status  Partially Met      PT LONG TERM GOAL #2   Title  decrease  pain 50%    Status  Partially Met      PT LONG TERM GOAL #3   Title  increase AROM to 5-115 degrees flexion    Status  On-going      PT LONG TERM GOAL #4   Title  return safely to the gym    Status  On-going            Plan - 06/13/18 1147    Clinical Impression Statement  Pt did well overall with today's treatment. She continues to lack full R knee extension, this is noticeable with all functional interventions. Weakness noted with controlled descents, pt did better with 4 inch step. postural cues to stand erect with standing march. Pt did well with eccentric extensions with available ROM.    Rehab Potential  Good    PT Frequency  3x / week    PT Duration  8 weeks    PT Treatment/Interventions  ADLs/Self Care Home Management;Cryotherapy;Electrical Stimulation;Therapeutic exercise;Therapeutic activities;Functional mobility training;Stair training;Gait training;Balance training;Neuromuscular re-education;Patient/family education;Manual techniques;Vasopneumatic Device    PT Next Visit Plan  Functional and eccentric load strengthening. Continues to stretch HS       Patient will benefit from skilled therapeutic intervention in order to improve the  following deficits and impairments:  Abnormal gait, Pain, Decreased scar mobility, Decreased range of motion, Decreased strength, Increased edema, Difficulty walking, Decreased balance  Visit Diagnosis: Difficulty in walking, not elsewhere classified  Stiffness of right knee, not elsewhere classified  Localized edema  Acute pain of right knee     Problem List Patient Active Problem List   Diagnosis Date Noted  . Persistent atrial fibrillation   . 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

## 2018-06-15 ENCOUNTER — Encounter: Payer: Self-pay | Admitting: Physical Therapy

## 2018-06-15 ENCOUNTER — Ambulatory Visit: Payer: PPO | Admitting: Physical Therapy

## 2018-06-15 DIAGNOSIS — M25661 Stiffness of right knee, not elsewhere classified: Secondary | ICD-10-CM

## 2018-06-15 DIAGNOSIS — R600 Localized edema: Secondary | ICD-10-CM

## 2018-06-15 DIAGNOSIS — M25561 Pain in right knee: Secondary | ICD-10-CM | POA: Diagnosis not present

## 2018-06-15 DIAGNOSIS — R262 Difficulty in walking, not elsewhere classified: Secondary | ICD-10-CM

## 2018-06-15 NOTE — Therapy (Addendum)
[place], [place], [address] Phone: [phone]   Fax:  [phone] Progress Note Reporting Period  05/19/18 to 06/15/18 for the first 10 visits  See note below for Objective Data and Assessment of Progress/Goals.      Physical Therapy Treatment  Patient Details  Name: Frank Hudson MRN: [id number] Date of Birth: Apr 25, 1951 Referring Provider (PT): Carlynn Spry Date: 06/15/2018  PT End of Session - 06/15/18 1141    Visit Number  10    Date for PT Re-Evaluation  07/18/18    PT Start Time  1100    PT Stop Time  1155    PT Time Calculation (min)  55 min    Activity Tolerance  Patient tolerated treatment well    Behavior During Therapy  [facility] for tasks assessed/performed       Past Medical History:  Diagnosis Date  . Arthritis   . BPH (benign prostatic hyperplasia)   . Osteoarthritis of right knee    End Stage  . PMR (polymyalgia rheumatica) (HCC)     Past Surgical History:  Procedure Laterality Date  . APPENDECTOMY    . CARDIOVERSION N/A 06/01/2018   Procedure: CARDIOVERSION;  Surgeon: [name], MD;  Location: [place];  Service: Cardiovascular;  Laterality: N/A;  . ELBOW SURGERY Left 2015  . MENISCECTOMY Right 1978?  . TOTAL KNEE ARTHROPLASTY Right 04/22/2018   Procedure: RIGHT TOTAL KNEE ARTHROPLASTY;  Surgeon: [name], MD;  Location: [place];  Service: Orthopedics;  Laterality: Right;    There were no vitals filed for this visit.  Subjective Assessment - 06/15/18 1057    Subjective  "It is going"    Currently in Pain?  Yes    Pain Score  3     Pain Location  Knee    Pain Orientation  Right         [facility] PT Assessment - 06/15/18 0001      AROM   AROM Assessment Site  Knee    Right Knee Extension  9    Right Knee Flexion  102      PROM   Right Knee Flexion  110                   [facility] Adult PT Treatment/Exercise - 06/15/18 0001      Knee/Hip Exercises: Aerobic   Stationary Bike  seat 8 full revs x4 min    Elliptical  I10 R3  2 in each way      Knee/Hip Exercises: Machines for Strengthening   Cybex Knee Flexion  RLE 20lb eccentrics 2x10, 10lb RLE 2x10    Cybex Leg Press  RLE 20lb 2x15      Knee/Hip Exercises: Standing   Heel Raises  Both;2 sets;15 reps;2 seconds    Forward Step Up  Right;Hand Hold: 0;Step Height: 8";1 set;15 reps    Other Standing Knee Exercises  RLE step up with red tband TKE resistance 2x10     Other Standing Knee Exercises  Controlled descents 4 in x10 each       Knee/Hip Exercises: Seated   Sit to Sand  2 sets;10 reps;without UE support   8lb dumbbell      Modalities   Modalities  Vasopneumatic      Vasopneumatic   Number Minutes Vasopneumatic   15 minutes    Vasopnuematic Location   Knee    Vasopneumatic Pressure  Medium  Vasopneumatic Temperature   34      Manual Therapy   Manual Therapy  Passive ROM;Joint mobilization    Passive ROM  R knee flex and ext                PT Short Term Goals - 05/25/18 1148      PT SHORT TERM GOAL #1   Title  independent with initial HEP    Status  Achieved        PT Long Term Goals - 06/15/18 1141      PT LONG TERM GOAL #1   Title  ascend and descend stairs step over step    Status  Partially Met      PT LONG TERM GOAL #2   Title  decrease pain 50%    Status  Achieved      PT LONG TERM GOAL #3   Title  increase AROM to 5-115 degrees flexion    Status  On-going      PT LONG TERM GOAL #4   Title  return safely to the gym    Status  Partially Met            Plan - 06/15/18 1143    Clinical Impression Statement  Pt enters clinic reporting more R knee stiffness than pain. Reports knees loosens after aerobic warm up. Verbal and tactile cues's to keep pelvis parallel with controlled descents. Good strength and effort with curls and extension. Some stretching with extension machine to gain mechanical advantage.       Rehab Potential  Good    PT Frequency  3x / week    PT Duration  8 weeks    PT Treatment/Interventions  ADLs/Self Care Home Management;Cryotherapy;Electrical Stimulation;Therapeutic exercise;Therapeutic activities;Functional mobility training;Stair training;Gait training;Balance training;Neuromuscular re-education;Patient/family education;Manual techniques;Vasopneumatic Device    PT Next Visit Plan  Functional and eccentric load strengthening. Continues to stretch HS       Patient will benefit from skilled therapeutic intervention in order to improve the following deficits and impairments:  Abnormal gait, Pain, Decreased scar mobility, Decreased range of motion, Decreased strength, Increased edema, Difficulty walking, Decreased balance  Visit Diagnosis: Difficulty in walking, not elsewhere classified  Stiffness of right knee, not elsewhere classified  Localized edema     Problem List Patient Active Problem List   Diagnosis Date Noted  . Persistent atrial fibrillation   . 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

## 2018-06-21 ENCOUNTER — Encounter: Payer: Self-pay | Admitting: Physical Therapy

## 2018-06-21 ENCOUNTER — Ambulatory Visit: Payer: PPO | Attending: Orthopedic Surgery | Admitting: Physical Therapy

## 2018-06-21 ENCOUNTER — Ambulatory Visit (INDEPENDENT_AMBULATORY_CARE_PROVIDER_SITE_OTHER): Payer: PPO | Admitting: Internal Medicine

## 2018-06-21 ENCOUNTER — Encounter: Payer: Self-pay | Admitting: Internal Medicine

## 2018-06-21 VITALS — BP 117/74 | HR 75 | Ht 68.0 in | Wt 201.2 lb

## 2018-06-21 DIAGNOSIS — R262 Difficulty in walking, not elsewhere classified: Secondary | ICD-10-CM | POA: Diagnosis not present

## 2018-06-21 DIAGNOSIS — I4891 Unspecified atrial fibrillation: Secondary | ICD-10-CM

## 2018-06-21 DIAGNOSIS — R600 Localized edema: Secondary | ICD-10-CM | POA: Insufficient documentation

## 2018-06-21 DIAGNOSIS — M25661 Stiffness of right knee, not elsewhere classified: Secondary | ICD-10-CM | POA: Insufficient documentation

## 2018-06-21 DIAGNOSIS — M25561 Pain in right knee: Secondary | ICD-10-CM

## 2018-06-21 NOTE — Therapy (Signed)
[place], [place], [address] Phone: [phone]   Fax:  [phone]  Physical Therapy Treatment  Patient Details  Name: Frank Hudson MRN: [id number] Date of Birth: 07/29/1950 Referring Provider (PT): Carlynn Spry Date: 06/21/2018  PT End of Session - 06/21/18 1226    Visit Number  11    Date for PT Re-Evaluation  07/18/18    PT Start Time  1145    PT Stop Time  1241    PT Time Calculation (min)  56 min    Activity Tolerance  Patient tolerated treatment well    Behavior During Therapy  [facility] for tasks assessed/performed       Past Medical History:  Diagnosis Date  . Arthritis   . BPH (benign prostatic hyperplasia)   . Osteoarthritis of right knee    End Stage  . PMR (polymyalgia rheumatica) (HCC)     Past Surgical History:  Procedure Laterality Date  . APPENDECTOMY    . CARDIOVERSION N/A 06/01/2018   Procedure: CARDIOVERSION;  Surgeon: [name], MD;  Location: [place];  Service: Cardiovascular;  Laterality: N/A;  . ELBOW SURGERY Left 2015  . MENISCECTOMY Right 1978?  . TOTAL KNEE ARTHROPLASTY Right 04/22/2018   Procedure: RIGHT TOTAL KNEE ARTHROPLASTY;  Surgeon: [name], MD;  Location: [place];  Service: Orthopedics;  Laterality: Right;    There were no vitals filed for this visit.  Subjective Assessment - 06/21/18 1149    Subjective  "Im good"    Currently in Pain?  Yes    Pain Score  3     Pain Location  Knee    Pain Orientation  Right                       [facility] Adult PT Treatment/Exercise - 06/21/18 0001      Ambulation/Gait   Stairs  Yes    Stairs Assistance  5: Supervision;6: Modified independent (Device/Increase time)    Stair Management Technique  One rail Right;Alternating pattern    Number of Stairs  48    Height of Stairs  6    Gait Comments  RLE eccentric load weakness and pain      Knee/Hip Exercises: Aerobic   Stationary Bike  seat 8 full revs x4 min    Elliptical  I10 R3  2 in each way    Nustep  L3 x 4 min N0 UE        Knee/Hip Exercises: Machines for Strengthening   Cybex Knee Flexion  RLE 20lb eccentrics 2x10, 10lb RLE 2x10    Cybex Leg Press  RLE 20lb 3x10    Total Gym Leg Press  RLE 30lb 3x10      Modalities   Modalities  Vasopneumatic      Vasopneumatic   Number Minutes Vasopneumatic   15 minutes    Vasopnuematic Location   Knee    Vasopneumatic Pressure  Medium      Manual Therapy   Manual Therapy  Passive ROM;Joint mobilization    Joint Mobilization  Tibiofemoral JT mobes for ext graded 2-3,scar mobilizations    Passive ROM  R knee flex and ext                PT Short Term Goals - 05/25/18 1148      PT SHORT TERM GOAL #1   Title  independent with initial HEP  Status  Achieved        PT Long Term Goals - 06/21/18 1229      PT LONG TERM GOAL #1   Title  ascend and descend stairs step over step    Status  Partially Met      PT LONG TERM GOAL #2   Title  decrease pain 50%    Status  Achieved      PT LONG TERM GOAL #3   Title  increase AROM to 5-115 degrees flexion    Status  On-going      PT LONG TERM GOAL #4   Title  return safely to the gym    Status  Achieved            Plan - 06/21/18 1226    Clinical Impression Statement  Good strength on all machine interventions. Pt was even able to tolerated additional weight. R knee is still lacking some extension. Some initial tightness and pain on recumbent bike but it loosened up. Pt reports R knee pain when descending stairs, cues needed to get pt to control decent instead of plopping down onto LLE.     Rehab Potential  Good    PT Frequency  3x / week    PT Duration  8 weeks    PT Treatment/Interventions  ADLs/Self Care Home Management;Cryotherapy;Electrical Stimulation;Therapeutic exercise;Therapeutic activities;Functional mobility training;Stair training;Gait training;Balance  training;Neuromuscular re-education;Patient/family education;Manual techniques;Vasopneumatic Device    PT Next Visit Plan  Functional and eccentric load strengthening. Continues to stretch HS       Patient will benefit from skilled therapeutic intervention in order to improve the following deficits and impairments:  Abnormal gait, Pain, Decreased scar mobility, Decreased range of motion, Decreased strength, Increased edema, Difficulty walking, Decreased balance  Visit Diagnosis: Difficulty in walking, not elsewhere classified  Stiffness of right knee, not elsewhere classified  Localized edema  Acute pain of right knee     Problem List Patient Active Problem List   Diagnosis Date Noted  . Persistent atrial fibrillation   . 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

## 2018-06-21 NOTE — Patient Instructions (Signed)
Medication Instructions:  Continue current medications If you need a refill on your cardiac medications before your next appointment, please call your pharmacy.   Follow-Up: At [facility], you and your health needs are our priority.  As part of our continuing mission to provide you with exceptional heart care, we have created designated Provider Care Teams.  These Care Teams include your primary Cardiologist (physician) and Advanced Practice Providers (APPs -  Physician Assistants and Nurse Practitioners) who all work together to provide you with the care you need, when you need it. You will need a follow up appointment in 6 months.  Please call our office 2 months in advance to schedule this appointment.  You may see Dr. Hilty or one of the following Advanced Practice Providers on your designated Care Team: Hao Meng, PA-C . Angela Duke, PA-C  Any Other Special Instructions Will Be Listed Below (If Applicable).    

## 2018-06-21 NOTE — Progress Notes (Signed)
OFFICE NOTE  Chief Complaint:  Follow-up cardioversion  Primary Care Physician: [name], MD  HPI:  Frank Hudson is a 68 y.o. male with a past medial history significant for PMR, BPH and knee osteoarthritis.  Recently he was found to be in atrial fibrillation.  This was thought to be new onset.  He was referred for cardioversion which I performed on 06/01/2018.  Unfortunately he did not convert despite several attempts and a long 4 to 5-second pause after shock, maintaining atrial fibrillation.  The reason he did not convert is unclear.  He underwent a recent echo which showed normal heart function and mildly dilated left atrium.  He is asymptomatic with his A. fib, denying palpitations, fatigue, shortness of breath with exertion or chest pain.  He underwent surgery for his knee which was uneventful.  Since then he continues to do well.  He has had no issues on anticoagulation.  Rate is well controlled.  He is planning on undergoing an upcoming manipulation procedure by a surgeon, I suspect for range of motion which would require coming off of anticoagulation.  I recommended that it was okay to discontinue anticoagulation for 2 days prior to the procedure only after 1 month following the cardioversion (or after 07/02/2018).  PMHx:  Past Medical History:  Diagnosis Date  . Arthritis   . BPH (benign prostatic hyperplasia)   . Osteoarthritis of right knee    End Stage  . PMR (polymyalgia rheumatica) (HCC)     Past Surgical History:  Procedure Laterality Date  . APPENDECTOMY    . CARDIOVERSION N/A 06/01/2018   Procedure: CARDIOVERSION;  Surgeon: [name], MD;  Location: [facility];  Service: Cardiovascular;  Laterality: N/A;  . ELBOW SURGERY Left 2015  . MENISCECTOMY Right 1978?  . TOTAL KNEE ARTHROPLASTY Right 04/22/2018   Procedure: RIGHT TOTAL KNEE ARTHROPLASTY;  Surgeon: [name], MD;  Location: [facility];  Service: Orthopedics;  Laterality: Right;    FAMHx:  No  family history on file.  SOCHx:   reports that he quit smoking about 16 years ago. His smoking use included cigarettes and cigars. He has never used smokeless tobacco. He reports current alcohol use of about 14.0 standard drinks of alcohol per week. No history on file for drug.  ALLERGIES:  Allergies  Allergen Reactions  . Amoxicillin Rash    DID THE REACTION INVOLVE: Swelling of the face/tongue/throat, SOB, or low BP? No Sudden or severe rash/hives, skin peeling, or the inside of the mouth or nose? Yes Did it require medical treatment? Yes When did it last happen?15 years If all above answers are "NO", may proceed with cephalosporin use.     ROS: Pertinent items noted in HPI and remainder of comprehensive ROS otherwise negative.  HOME MEDS: Current Outpatient Medications on File Prior to Visit  Medication Sig Dispense Refill  . apixaban (ELIQUIS) 5 MG TABS tablet Take 1 tablet (5 mg total) by mouth 2 (two) times daily. 60 tablet 0  . dutasteride (AVODART) 0.5 MG capsule Take 0.5 mg by mouth every other day.    . metoprolol tartrate (LOPRESSOR) 25 MG tablet Take 0.5 tablets (12.5 mg total) by mouth 2 (two) times daily. 180 tablet 1  . oxyCODONE-acetaminophen (PERCOCET) 5-325 MG tablet Take 1 tablet by mouth every 4 (four) hours as needed for severe pain. 30 tablet 0  . temazepam (RESTORIL) 30 MG capsule Take 30 mg by mouth at bedtime as needed for sleep.     No  current facility-administered medications on file prior to visit.     LABS/IMAGING: No results found for this or any previous visit (from the past 48 hour(s)). No results found.  LIPID PANEL: No results found for: CHOL, TRIG, HDL, CHOLHDL, VLDL, LDLCALC, LDLDIRECT   WEIGHTS: Wt Readings from Last 3 Encounters:  06/21/18 201 lb 3.2 oz (91.3 kg)  06/01/18 202 lb (91.6 kg)  05/20/18 202 lb (91.6 kg)    VITALS: BP 117/74   Pulse 75   Ht 5\' 8"  (1.727 m)   Wt 201 lb 3.2 oz (91.3 kg)   SpO2 100%   BMI 30.59  kg/m   EXAM: General appearance: alert and no distress Lungs: clear to auscultation bilaterally Heart: irregularly irregular rhythm Extremities: extremities normal, atraumatic, no cyanosis or edema Neurologic: Grossly normal  EKG: Deferred  ASSESSMENT: 1. Persistent atrial fibrillation, refractory to cardioversion (CHADSVASC score of 3) 2. Normal LVEF 60 to 65%, mild LAE (05/2018) 3. Acceptable risk for upcoming procedure  PLAN: 1.   Frank Hudson is in A. fib but is asymptomatic with this.  After 07/02/2018 he could hold his Eliquis 2 days prior to planned manipulation of the knee in the operating room.  He would then restarted afterwards.  Since he continues to be asymptomatic and his echo shows normal systolic function, I think there is little evidence to suggest we should pursue antiarrhythmic therapy or ablation to try to get him back in rhythm rather I would recommend rate control and anticoagulation.  He is in agreement with this.  Plan follow-up with me in 6 months.  [name], MD, [facility], [place] Director of the [facility] Lipid Disorders &  Cardiovascular [facility] Reduction Clinic Diplomate of the American Board of Clinical Lipidology Attending Cardiologist  Direct Dial: [phone]  Fax: [phone]  Website:  [email or web address] 06/21/2018, 4:18 PM

## 2018-06-22 ENCOUNTER — Other Ambulatory Visit (HOSPITAL_COMMUNITY): Payer: PPO

## 2018-06-23 ENCOUNTER — Ambulatory Visit: Payer: PPO | Admitting: Physical Therapy

## 2018-06-23 ENCOUNTER — Encounter: Payer: Self-pay | Admitting: Physical Therapy

## 2018-06-23 DIAGNOSIS — R262 Difficulty in walking, not elsewhere classified: Secondary | ICD-10-CM

## 2018-06-23 DIAGNOSIS — M25661 Stiffness of right knee, not elsewhere classified: Secondary | ICD-10-CM

## 2018-06-23 DIAGNOSIS — M25561 Pain in right knee: Secondary | ICD-10-CM

## 2018-06-23 DIAGNOSIS — R600 Localized edema: Secondary | ICD-10-CM

## 2018-06-23 NOTE — Therapy (Signed)
[place], [place], [address] Phone: [phone]   Fax:  [phone]  Physical Therapy Treatment  Patient Details  Name: Frank Hudson MRN: [id number] Date of Birth: 10-05-1950 Referring Provider (PT): Carlynn Spry Date: 06/23/2018  PT End of Session - 06/23/18 0843    Visit Number  12    Date for PT Re-Evaluation  07/18/18    PT Start Time  0800    PT Stop Time  0857    PT Time Calculation (min)  57 min    Activity Tolerance  Patient tolerated treatment well    Behavior During Therapy  [facility] for tasks assessed/performed       Past Medical History:  Diagnosis Date  . Arthritis   . BPH (benign prostatic hyperplasia)   . Osteoarthritis of right knee    End Stage  . PMR (polymyalgia rheumatica) (HCC)     Past Surgical History:  Procedure Laterality Date  . APPENDECTOMY    . CARDIOVERSION N/A 06/01/2018   Procedure: CARDIOVERSION;  Surgeon: [name], MD;  Location: [facility];  Service: Cardiovascular;  Laterality: N/A;  . ELBOW SURGERY Left 2015  . MENISCECTOMY Right 1978?  . TOTAL KNEE ARTHROPLASTY Right 04/22/2018   Procedure: RIGHT TOTAL KNEE ARTHROPLASTY;  Surgeon: [name], MD;  Location: [facility];  Service: Orthopedics;  Laterality: Right;    There were no vitals filed for this visit.  Subjective Assessment - 06/23/18 0806    Subjective  "Sluggish"    Currently in Pain?  Yes    Pain Score  3     Pain Location  Knee    Pain Orientation  Right                       [facility] Adult PT Treatment/Exercise - 06/23/18 0001      High Level Balance   High Level Balance Comments  SLS rebound ball toss 3x10, BOSU standing 3x10 sec, BOSU step up and overs       Knee/Hip Exercises: Aerobic   Stationary Bike  seat 8 full revs x4 min    Elliptical  I10 R5  3 in each way      Knee/Hip Exercises: Machines for Strengthening   Total Gym Leg Press  20lb RLE  on dyna disk 2x10       Knee/Hip Exercises: Standing   Lateral Step Up  Right;10 reps;Hand Hold: 0;2 sets;Step Height: 8"      Knee/Hip Exercises: Seated   Sit to Sand  2 sets;10 reps;without UE support   Blue Tband TKE resistance     Modalities   Modalities  Vasopneumatic      Vasopneumatic   Number Minutes Vasopneumatic   15 minutes    Vasopnuematic Location   Knee    Vasopneumatic Pressure  Medium    Vasopneumatic Temperature   34      Manual Therapy   Manual Therapy  Passive ROM;Joint mobilization    Manual therapy comments  Quat set with ext    Passive ROM  R knee ext                PT Short Term Goals - 05/25/18 1148      PT SHORT TERM GOAL #1   Title  independent with initial HEP    Status  Achieved        PT Long Term Goals - 06/21/18 1229  PT LONG TERM GOAL #1   Title  ascend and descend stairs step over step    Status  Partially Met      PT LONG TERM GOAL #2   Title  decrease pain 50%    Status  Achieved      PT LONG TERM GOAL #3   Title  increase AROM to 5-115 degrees flexion    Status  On-going      PT LONG TERM GOAL #4   Title  return safely to the gym    Status  Achieved            Plan - 06/23/18 1583    Clinical Impression Statement  Pt very unstable to SLS and on non compliant surfaces. Min assist to correct balance. Did well towards ends of session with step up and overs on BOSU. Cues to distribute pressure on leg press when LE on Dyna disk. Pt tends to ambulate with wide base of support during resisted gait. Cues to correct.     Rehab Potential  Good    PT Frequency  3x / week    PT Duration  8 weeks    PT Treatment/Interventions  ADLs/Self Care Home Management;Cryotherapy;Electrical Stimulation;Therapeutic exercise;Therapeutic activities;Functional mobility training;Stair training;Gait training;Balance training;Neuromuscular re-education;Patient/family education;Manual techniques;Vasopneumatic Device    PT Next Visit  Plan  Functional and eccentric load strengthening. Continues to stretch HS and balance       Patient will benefit from skilled therapeutic intervention in order to improve the following deficits and impairments:  Abnormal gait, Pain, Decreased scar mobility, Decreased range of motion, Decreased strength, Increased edema, Difficulty walking, Decreased balance  Visit Diagnosis: Stiffness of right knee, not elsewhere classified  Difficulty in walking, not elsewhere classified  Localized edema  Acute pain of right knee     Problem List Patient Active Problem List   Diagnosis Date Noted  . Persistent atrial fibrillation   . 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

## 2018-06-27 ENCOUNTER — Ambulatory Visit: Payer: PPO | Admitting: Physical Therapy

## 2018-06-27 ENCOUNTER — Encounter: Payer: Self-pay | Admitting: Physical Therapy

## 2018-06-27 DIAGNOSIS — R262 Difficulty in walking, not elsewhere classified: Secondary | ICD-10-CM | POA: Diagnosis not present

## 2018-06-27 DIAGNOSIS — M25561 Pain in right knee: Secondary | ICD-10-CM

## 2018-06-27 DIAGNOSIS — R600 Localized edema: Secondary | ICD-10-CM

## 2018-06-27 DIAGNOSIS — M25661 Stiffness of right knee, not elsewhere classified: Secondary | ICD-10-CM

## 2018-06-27 NOTE — Therapy (Signed)
[place], [place], [address] Phone: [phone]   Fax:  [phone]  Physical Therapy Treatment  Patient Details  Name: Frank Hudson MRN: [id number] Date of Birth: [birth year] Referring Provider (PT): Carlynn Spry Date: 06/27/2018  PT End of Session - 06/27/18 1056    Visit Number  13    Date for PT Re-Evaluation  07/18/18    PT Start Time  0762    PT Stop Time  1110    PT Time Calculation (min)  55 min    Activity Tolerance  Patient tolerated treatment well    Behavior During Therapy  [facility] for tasks assessed/performed       Past Medical History:  Diagnosis Date  . Arthritis   . BPH (benign prostatic hyperplasia)   . Osteoarthritis of right knee    End Stage  . PMR (polymyalgia rheumatica) (HCC)     Past Surgical History:  Procedure Laterality Date  . APPENDECTOMY    . CARDIOVERSION N/A 06/01/2018   Procedure: CARDIOVERSION;  Surgeon: [name], MD;  Location: [facility];  Service: Cardiovascular;  Laterality: N/A;  . ELBOW SURGERY Left 2015  . MENISCECTOMY Right 1978?  . TOTAL KNEE ARTHROPLASTY Right 04/22/2018   Procedure: RIGHT TOTAL KNEE ARTHROPLASTY;  Surgeon: [name], MD;  Location: [facility];  Service: Orthopedics;  Laterality: Right;    There were no vitals filed for this visit.  Subjective Assessment - 06/27/18 1018    Subjective  "I am feeling ok"    Currently in Pain?  Yes    Pain Score  3     Pain Location  Knee    Pain Orientation  Right         [facility] PT Assessment - 06/27/18 0001      AROM   Right Knee Extension  9    Right Knee Flexion  102                   [facility] Adult PT Treatment/Exercise - 06/27/18 0001      Ambulation/Gait   Stairs  Yes    Stairs Assistance  6: Modified independent (Device/Increase time)    Stair Management Technique  One rail Right;Alternating pattern    Number of Stairs  48    Height of Stairs  6     Gait Comments  RLE eccentric load weakness       High Level Balance   High Level Balance Comments  SLS rebound ball toss 3x10, BOSU standing 3x10 sec, BOSU step up and overs       Knee/Hip Exercises: Aerobic   Elliptical  I10 R5  2 in each way    Nustep  L3 x 6 min N0 UE        Knee/Hip Exercises: Machines for Strengthening   Cybex Knee Flexion  RLE 20lb eccentrics 2x10, 10lb RLE 2x10    Cybex Leg Press  RLE 20lb 3x10    Total Gym Leg Press  20lb RLE on dyna disk 2x10       Knee/Hip Exercises: Standing   Heel Raises  Both;2 sets;15 reps;2 seconds      Modalities   Modalities  Vasopneumatic      Vasopneumatic   Number Minutes Vasopneumatic   15 minutes    Vasopnuematic Location   Knee    Vasopneumatic Pressure  Medium    Vasopneumatic Temperature   34  PT Short Term Goals - 05/25/18 1148      PT SHORT TERM GOAL #1   Title  independent with initial HEP    Status  Achieved        PT Long Term Goals - 06/21/18 1229      PT LONG TERM GOAL #1   Title  ascend and descend stairs step over step    Status  Partially Met      PT LONG TERM GOAL #2   Title  decrease pain 50%    Status  Achieved      PT LONG TERM GOAL #3   Title  increase AROM to 5-115 degrees flexion    Status  On-going      PT LONG TERM GOAL #4   Title  return safely to the gym    Status  Achieved            Plan - 06/27/18 1056    Clinical Impression Statement  No improvement with R knee AROM, he continues to lack both flexion and extension, more so extension. Excentric load weakness remains with stair negotiation but pt reports no pain. He continues to struggle with SLS in regards to balance. Good strength with seated leg curls and extensions.     Rehab Potential  Good    PT Frequency  1x / week   Spoke to lead PT, Will decrease to 1 per week to save visits for possible manipulation   PT Treatment/Interventions  ADLs/Self Care Home Management;Cryotherapy;Electrical  Stimulation;Therapeutic exercise;Therapeutic activities;Functional mobility training;Stair training;Gait training;Balance training;Neuromuscular re-education;Patient/family education;Manual techniques;Vasopneumatic Device    PT Next Visit Plan  Functional and eccentric load strengthening. Continues to stretch HS and balance       Patient will benefit from skilled therapeutic intervention in order to improve the following deficits and impairments:  Abnormal gait, Pain, Decreased scar mobility, Decreased range of motion, Decreased strength, Increased edema, Difficulty walking, Decreased balance  Visit Diagnosis: Stiffness of right knee, not elsewhere classified  Difficulty in walking, not elsewhere classified  Localized edema  Acute pain of right knee     Problem List Patient Active Problem List   Diagnosis Date Noted  . Persistent atrial fibrillation   . 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

## 2018-06-29 ENCOUNTER — Encounter: Payer: PPO | Admitting: Physical Therapy

## 2018-06-29 ENCOUNTER — Other Ambulatory Visit (HOSPITAL_COMMUNITY): Payer: PPO

## 2018-07-01 ENCOUNTER — Encounter: Payer: PPO | Admitting: Physical Therapy

## 2018-07-04 ENCOUNTER — Ambulatory Visit: Payer: PPO | Admitting: Internal Medicine

## 2018-07-05 ENCOUNTER — Encounter: Payer: Self-pay | Admitting: Physical Therapy

## 2018-07-05 ENCOUNTER — Ambulatory Visit: Payer: PPO | Admitting: Physical Therapy

## 2018-07-05 DIAGNOSIS — M25561 Pain in right knee: Secondary | ICD-10-CM

## 2018-07-05 DIAGNOSIS — R262 Difficulty in walking, not elsewhere classified: Secondary | ICD-10-CM | POA: Diagnosis not present

## 2018-07-05 DIAGNOSIS — R600 Localized edema: Secondary | ICD-10-CM

## 2018-07-05 DIAGNOSIS — M25661 Stiffness of right knee, not elsewhere classified: Secondary | ICD-10-CM

## 2018-07-05 NOTE — Therapy (Signed)
[place], [place], [address] Phone: [phone]   Fax:  [phone]  Physical Therapy Treatment  Patient Details  Name: Frank Hudson MRN: [id number] Date of Birth: March 16, 1951 Referring Provider (PT): Carlynn Spry Date: 07/05/2018  PT End of Session - 07/05/18 1145    Visit Number  14    Date for PT Re-Evaluation  07/18/18    PT Start Time  1100    PT Stop Time  1200    PT Time Calculation (min)  60 min    Activity Tolerance  Patient tolerated treatment well    Behavior During Therapy  [facility] for tasks assessed/performed       Past Medical History:  Diagnosis Date  . Arthritis   . BPH (benign prostatic hyperplasia)   . Osteoarthritis of right knee    End Stage  . PMR (polymyalgia rheumatica) (HCC)     Past Surgical History:  Procedure Laterality Date  . APPENDECTOMY    . CARDIOVERSION N/A 06/01/2018   Procedure: CARDIOVERSION;  Surgeon: [name], MD;  Location: [place];  Service: Cardiovascular;  Laterality: N/A;  . ELBOW SURGERY Left 2015  . MENISCECTOMY Right 1978?  . TOTAL KNEE ARTHROPLASTY Right 04/22/2018   Procedure: RIGHT TOTAL KNEE ARTHROPLASTY;  Surgeon: [name], MD;  Location: [place];  Service: Orthopedics;  Laterality: Right;    There were no vitals filed for this visit.  Subjective Assessment - 07/05/18 1105    Subjective  Manipulation scheduled for tomorrow    Currently in Pain?  Yes    Pain Score  3     Pain Location  Knee    Pain Orientation  Right         [facility] PT Assessment - 07/05/18 0001      AROM   Right Knee Extension  9    Right Knee Flexion  102                   [facility] Adult PT Treatment/Exercise - 07/05/18 0001      Ambulation/Gait   Stairs  Yes    Stairs Assistance  6: Modified independent (Device/Increase time)    Stair Management Technique  One rail Right;Alternating pattern    Number of Stairs  48    Height of Stairs  6    Gait Comments  RLE eccentric load weakness       High Level Balance   High Level Balance Comments  SLS rebound ball toss 2x10, BOSU with ball toss       Knee/Hip Exercises: Aerobic   Elliptical  I10 R5  2 in each way    Recumbent Bike  L0 x 5 min     Nustep  L6 x3 min LE only       Knee/Hip Exercises: Machines for Strengthening   Cybex Knee Flexion  RLE 25lb eccentrics 2x10, 15lb RLE 2x10    Cybex Leg Press  RLE 25lb 2x10    Total Gym Leg Press  20lb RLE 2x10       Knee/Hip Exercises: Standing   Walking with Sports Cord  40lb 4 way x5 each      Modalities   Modalities  Vasopneumatic      Vasopneumatic   Number Minutes Vasopneumatic   15 minutes    Vasopnuematic Location   Knee    Vasopneumatic Pressure  Medium    Vasopneumatic Temperature   34  PT Short Term Goals - 05/25/18 1148      PT SHORT TERM GOAL #1   Title  independent with initial HEP    Status  Achieved        PT Long Term Goals - 07/05/18 1146      PT LONG TERM GOAL #1   Title  ascend and descend stairs step over step    Status  Achieved      PT LONG TERM GOAL #2   Title  decrease pain 50%    Status  Achieved      PT LONG TERM GOAL #3   Title  increase AROM to 5-115 degrees flexion    Status  On-going      PT LONG TERM GOAL #4   Title  return safely to the gym    Status  Achieved            Plan - 07/05/18 1146    Clinical Impression Statement  Pt is doing well overall. He reports no functional limitations. He does continues to have some R quad weakness with eccentric loads. Good strength but R knee ROM ins limited more so with extension. Pt reports that she has scheduled for a knee manipulation tomorrow.    PT Frequency  1x / week    PT Duration  8 weeks    PT Treatment/Interventions  ADLs/Self Care Home Management;Cryotherapy;Electrical Stimulation;Therapeutic exercise;Therapeutic activities;Functional mobility training;Stair training;Gait  training;Balance training;Neuromuscular re-education;Patient/family education;Manual techniques;Vasopneumatic Device    PT Next Visit Plan  Manipulation 07/06/18, Check ROM       Patient will benefit from skilled therapeutic intervention in order to improve the following deficits and impairments:  Abnormal gait, Pain, Decreased scar mobility, Decreased range of motion, Decreased strength, Increased edema, Difficulty walking, Decreased balance  Visit Diagnosis: Difficulty in walking, not elsewhere classified  Stiffness of right knee, not elsewhere classified  Localized edema  Acute pain of right knee     Problem List Patient Active Problem List   Diagnosis Date Noted  . Persistent atrial fibrillation   . 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

## 2018-07-06 DIAGNOSIS — M24661 Ankylosis, right knee: Secondary | ICD-10-CM | POA: Diagnosis not present

## 2018-07-06 DIAGNOSIS — Z96651 Presence of right artificial knee joint: Secondary | ICD-10-CM | POA: Diagnosis not present

## 2018-07-06 DIAGNOSIS — G8918 Other acute postprocedural pain: Secondary | ICD-10-CM | POA: Diagnosis not present

## 2018-07-07 ENCOUNTER — Encounter: Payer: PPO | Admitting: Physical Therapy

## 2018-07-08 ENCOUNTER — Ambulatory Visit: Payer: PPO | Admitting: Physical Therapy

## 2018-07-08 ENCOUNTER — Encounter: Payer: Self-pay | Admitting: Physical Therapy

## 2018-07-08 DIAGNOSIS — R262 Difficulty in walking, not elsewhere classified: Secondary | ICD-10-CM

## 2018-07-08 DIAGNOSIS — R600 Localized edema: Secondary | ICD-10-CM

## 2018-07-08 DIAGNOSIS — M25561 Pain in right knee: Secondary | ICD-10-CM

## 2018-07-08 DIAGNOSIS — M25661 Stiffness of right knee, not elsewhere classified: Secondary | ICD-10-CM

## 2018-07-08 NOTE — Therapy (Signed)
[place], [place], [address] Phone: [phone]   Fax:  [phone]  Physical Therapy Treatment  Patient Details  Name: Frank Hudson MRN: [id number] Date of Birth: 07/07/1950 Referring Provider (PT): Carlynn Spry Date: 07/08/2018  PT End of Session - 07/08/18 1145    Visit Number  15    Date for PT Re-Evaluation  07/18/18    PT Start Time  1100    PT Stop Time  1158    PT Time Calculation (min)  58 min    Activity Tolerance  Patient tolerated treatment well    Behavior During Therapy  [facility] for tasks assessed/performed       Past Medical History:  Diagnosis Date  . Arthritis   . BPH (benign prostatic hyperplasia)   . Osteoarthritis of right knee    End Stage  . PMR (polymyalgia rheumatica) (HCC)     Past Surgical History:  Procedure Laterality Date  . APPENDECTOMY    . CARDIOVERSION N/A 06/01/2018   Procedure: CARDIOVERSION;  Surgeon: [name], MD;  Location: [place];  Service: Cardiovascular;  Laterality: N/A;  . ELBOW SURGERY Left 2015  . MENISCECTOMY Right 1978?  . TOTAL KNEE ARTHROPLASTY Right 04/22/2018   Procedure: RIGHT TOTAL KNEE ARTHROPLASTY;  Surgeon: [name], MD;  Location: [place];  Service: Orthopedics;  Laterality: Right;    There were no vitals filed for this visit.  Subjective Assessment - 07/08/18 1102    Subjective  Pt reports that th manipulation helped, he stated less stiffness when he wakes up in the morning and with walking    Currently in Pain?  No/denies    Pain Score  0-No pain         [facility] PT Assessment - 07/08/18 0001      AROM   Right Knee Extension  8    Right Knee Flexion  105      PROM   Right Knee Flexion  115                   [facility] Adult PT Treatment/Exercise - 07/08/18 0001      Ambulation/Gait   Stairs  Yes    Stairs Assistance  6: Modified independent (Device/Increase time)    Stair  Management Technique  One rail Right;Alternating pattern    Number of Stairs  48    Height of Stairs  6    Gait Comments  Tactile cues to pt hip to stay square, tensa to dip L hip to avoid more R knee flexion, R quad exxentric load weakness      Knee/Hip Exercises: Aerobic   Elliptical  I10 R5  2 in each way    Recumbent Bike  L0 x 4 min     Nustep  L4  x4 min LE only       Knee/Hip Exercises: Machines for Strengthening   Cybex Knee Flexion  RLE 25lb eccentrics 2x10, 15lb RLE 2x10    Cybex Leg Press  RLE 25lb 2x10    Total Gym Leg Press  50lb 2x15      Knee/Hip Exercises: Standing   Walking with Sports Cord  40lb 4 way x5 each      Modalities   Modalities  Vasopneumatic      Vasopneumatic   Number Minutes Vasopneumatic   15 minutes    Vasopnuematic Location   Knee    Vasopneumatic Pressure  Medium  Vasopneumatic Temperature   34      Manual Therapy   Manual Therapy  Passive ROM;Joint mobilization    Manual therapy comments  Quat set with ext    Passive ROM  R knee flex and ext                PT Short Term Goals - 05/25/18 1148      PT SHORT TERM GOAL #1   Title  independent with initial HEP    Status  Achieved        PT Long Term Goals - 07/05/18 1146      PT LONG TERM GOAL #1   Title  ascend and descend stairs step over step    Status  Achieved      PT LONG TERM GOAL #2   Title  decrease pain 50%    Status  Achieved      PT LONG TERM GOAL #3   Title  increase AROM to 5-115 degrees flexion    Status  On-going      PT LONG TERM GOAL #4   Title  return safely to the gym    Status  Achieved            Plan - 07/08/18 1146    Clinical Impression Statement  Patient returns to Pt after R knee manipulation. No really increase in ROM but gait has improved with more R knee and hip flexion. pt also reports less stiffness in R knee Makin it easier to walk. Some hip compensation noted when descending stairs. Tactile que's needed to keep hips square.      Rehab Potential  Good    PT Treatment/Interventions  ADLs/Self Care Home Management;Cryotherapy;Electrical Stimulation;Therapeutic exercise;Therapeutic activities;Functional mobility training;Stair training;Gait training;Balance training;Neuromuscular re-education;Patient/family education;Manual techniques;Vasopneumatic Device    PT Next Visit Plan  Eccentric load strenght R quad       Patient will benefit from skilled therapeutic intervention in order to improve the following deficits and impairments:  Abnormal gait, Pain, Decreased scar mobility, Decreased range of motion, Decreased strength, Increased edema, Difficulty walking, Decreased balance  Visit Diagnosis: Difficulty in walking, not elsewhere classified  Stiffness of right knee, not elsewhere classified  Acute pain of right knee  Localized edema     Problem List Patient Active Problem List   Diagnosis Date Noted  . Persistent atrial fibrillation   . 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

## 2018-07-11 ENCOUNTER — Encounter: Payer: PPO | Admitting: Physical Therapy

## 2018-07-11 ENCOUNTER — Encounter: Payer: Self-pay | Admitting: Physical Therapy

## 2018-07-11 ENCOUNTER — Ambulatory Visit: Payer: PPO | Admitting: Physical Therapy

## 2018-07-11 DIAGNOSIS — R262 Difficulty in walking, not elsewhere classified: Secondary | ICD-10-CM

## 2018-07-11 DIAGNOSIS — R600 Localized edema: Secondary | ICD-10-CM

## 2018-07-11 DIAGNOSIS — M25561 Pain in right knee: Secondary | ICD-10-CM

## 2018-07-11 DIAGNOSIS — M25661 Stiffness of right knee, not elsewhere classified: Secondary | ICD-10-CM

## 2018-07-11 NOTE — Therapy (Signed)
[place], [place], [address] Phone: [phone]   Fax:  [phone]  Physical Therapy Treatment  Patient Details  Name: Frank Hudson MRN: [id number] Date of Birth: 04/21/1951 Referring Provider (PT): Carlynn Spry Date: 07/11/2018  PT End of Session - 07/11/18 1229    Visit Number  16    Date for PT Re-Evaluation  07/18/18    PT Start Time  1145    PT Stop Time  1240    PT Time Calculation (min)  55 min    Activity Tolerance  Patient tolerated treatment well    Behavior During Therapy  [facility] for tasks assessed/performed       Past Medical History:  Diagnosis Date  . Arthritis   . BPH (benign prostatic hyperplasia)   . Osteoarthritis of right knee    End Stage  . PMR (polymyalgia rheumatica) (HCC)     Past Surgical History:  Procedure Laterality Date  . APPENDECTOMY    . CARDIOVERSION N/A 06/01/2018   Procedure: CARDIOVERSION;  Surgeon: [name], MD;  Location: [place];  Service: Cardiovascular;  Laterality: N/A;  . ELBOW SURGERY Left 2015  . MENISCECTOMY Right 1978?  . TOTAL KNEE ARTHROPLASTY Right 04/22/2018   Procedure: RIGHT TOTAL KNEE ARTHROPLASTY;  Surgeon: [name], MD;  Location: [place];  Service: Orthopedics;  Laterality: Right;    There were no vitals filed for this visit.  Subjective Assessment - 07/11/18 1147    Subjective  "It is doing really good"    Currently in Pain?  No/denies                       [facility] Adult PT Treatment/Exercise - 07/11/18 0001      Ambulation/Gait   Stairs  Yes    Stairs Assistance  6: Modified independent (Device/Increase time)    Stair Management Technique  One rail Right;Alternating pattern    Number of Stairs  48    Height of Stairs  6      High Level Balance   High Level Balance Comments  SLS rebound ball toss 2x10,       Knee/Hip Exercises: Aerobic   Elliptical  I12 R6  2 in each way    Recumbent Bike  L2 x 4 min       Knee/Hip Exercises: Machines for Strengthening   Cybex Knee Flexion  RLE 15lb 2x15    Cybex Leg Press  RLE 25lb 2x15    Total Gym Leg Press  60lb 2x15, 20lb RLE 2x10       Knee/Hip Exercises: Standing   Heel Raises  2 sets;15 reps;2 seconds;Right    Lateral Step Up  Right;Hand Hold: 0;2 sets;Step Height: 8";15 reps;10 reps      Knee/Hip Exercises: Seated   Other Seated Knee/Hip Exercises  RLE standing from UBE seat 2x10       Vasopneumatic   Number Minutes Vasopneumatic   15 minutes    Vasopnuematic Location   Knee    Vasopneumatic Pressure  Medium    Vasopneumatic Temperature   34               PT Short Term Goals - 05/25/18 1148      PT SHORT TERM GOAL #1   Title  independent with initial HEP    Status  Achieved        PT Long Term Goals - 07/05/18  [facility] #1   Title  ascend and descend stairs step over step    Status  Achieved      PT LONG TERM GOAL #2   Title  decrease pain 50%    Status  Achieved      PT LONG TERM GOAL #3   Title  increase AROM to 5-115 degrees flexion    Status  On-going      PT LONG TERM GOAL #4   Title  return safely to the gym    Status  Achieved            Plan - 07/11/18 1233    Clinical Impression Statement  Pt continues to have a positive response from manipulation, no major increase on AROM, less morning stiffness in R knee, pt has a more natural gait pattern, and less pain with PROM. Pt did well with stair negotiation, cues to control descent  with RLE . Little compensation needed with lateral step up.    Rehab Potential  Good    PT Treatment/Interventions  ADLs/Self Care Home Management;Cryotherapy;Electrical Stimulation;Therapeutic exercise;Therapeutic activities;Functional mobility training;Stair training;Gait training;Balance training;Neuromuscular re-education;Patient/family education;Manual techniques;Vasopneumatic Device    PT Next Visit Plan  Eccentric load  strength R quad, functional strength, and balance       Patient will benefit from skilled therapeutic intervention in order to improve the following deficits and impairments:  Abnormal gait, Pain, Decreased scar mobility, Decreased range of motion, Decreased strength, Increased edema, Difficulty walking, Decreased balance  Visit Diagnosis: Difficulty in walking, not elsewhere classified  Stiffness of right knee, not elsewhere classified  Acute pain of right knee  Localized edema     Problem List Patient Active Problem List   Diagnosis Date Noted  . Persistent atrial fibrillation   . 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

## 2018-07-13 ENCOUNTER — Ambulatory Visit: Payer: PPO | Admitting: Physical Therapy

## 2018-07-15 ENCOUNTER — Encounter: Payer: PPO | Admitting: Physical Therapy

## 2018-07-18 ENCOUNTER — Ambulatory Visit: Payer: PPO | Admitting: Internal Medicine

## 2018-10-03 DIAGNOSIS — J029 Acute pharyngitis, unspecified: Secondary | ICD-10-CM | POA: Diagnosis not present

## 2018-11-17 DIAGNOSIS — C44319 Basal cell carcinoma of skin of other parts of face: Secondary | ICD-10-CM | POA: Diagnosis not present

## 2018-11-17 DIAGNOSIS — D485 Neoplasm of uncertain behavior of skin: Secondary | ICD-10-CM | POA: Diagnosis not present

## 2018-11-17 DIAGNOSIS — L821 Other seborrheic keratosis: Secondary | ICD-10-CM | POA: Diagnosis not present

## 2018-12-22 DIAGNOSIS — L570 Actinic keratosis: Secondary | ICD-10-CM | POA: Diagnosis not present

## 2018-12-22 DIAGNOSIS — D2239 Melanocytic nevi of other parts of face: Secondary | ICD-10-CM | POA: Diagnosis not present

## 2018-12-22 DIAGNOSIS — L821 Other seborrheic keratosis: Secondary | ICD-10-CM | POA: Diagnosis not present

## 2018-12-22 DIAGNOSIS — C44329 Squamous cell carcinoma of skin of other parts of face: Secondary | ICD-10-CM | POA: Diagnosis not present

## 2018-12-30 DIAGNOSIS — R972 Elevated prostate specific antigen [PSA]: Secondary | ICD-10-CM | POA: Diagnosis not present

## 2018-12-30 DIAGNOSIS — N138 Other obstructive and reflux uropathy: Secondary | ICD-10-CM | POA: Diagnosis not present

## 2018-12-30 DIAGNOSIS — N401 Enlarged prostate with lower urinary tract symptoms: Secondary | ICD-10-CM | POA: Diagnosis not present

## 2019-01-19 NOTE — Telephone Encounter (Signed)
LM concerning MD having virtual clinic only on 01/25/2019. Asked for MyChart response or call back to confirm virtual visit is OK or if he wishes to r/s. Appointment type changed

## 2019-01-25 ENCOUNTER — Telehealth: Payer: PPO | Admitting: Internal Medicine

## 2019-03-20 DIAGNOSIS — E7800 Pure hypercholesterolemia, unspecified: Secondary | ICD-10-CM | POA: Diagnosis not present

## 2019-03-20 DIAGNOSIS — N401 Enlarged prostate with lower urinary tract symptoms: Secondary | ICD-10-CM | POA: Diagnosis not present

## 2019-03-20 DIAGNOSIS — N138 Other obstructive and reflux uropathy: Secondary | ICD-10-CM | POA: Diagnosis not present

## 2019-03-20 DIAGNOSIS — R972 Elevated prostate specific antigen [PSA]: Secondary | ICD-10-CM | POA: Diagnosis not present

## 2019-03-20 DIAGNOSIS — I4891 Unspecified atrial fibrillation: Secondary | ICD-10-CM | POA: Diagnosis not present

## 2019-03-20 DIAGNOSIS — R739 Hyperglycemia, unspecified: Secondary | ICD-10-CM | POA: Diagnosis not present

## 2019-03-20 DIAGNOSIS — Z0000 Encounter for general adult medical examination without abnormal findings: Secondary | ICD-10-CM | POA: Diagnosis not present

## 2019-03-20 DIAGNOSIS — M353 Polymyalgia rheumatica: Secondary | ICD-10-CM | POA: Diagnosis not present

## 2019-03-20 DIAGNOSIS — Z96651 Presence of right artificial knee joint: Secondary | ICD-10-CM | POA: Diagnosis not present

## 2019-03-24 ENCOUNTER — Other Ambulatory Visit: Payer: Self-pay

## 2019-03-24 ENCOUNTER — Encounter: Payer: Self-pay | Admitting: Internal Medicine

## 2019-03-24 ENCOUNTER — Ambulatory Visit (INDEPENDENT_AMBULATORY_CARE_PROVIDER_SITE_OTHER): Payer: PPO | Admitting: Internal Medicine

## 2019-03-24 VITALS — BP 135/88 | HR 60 | Temp 97.3°F | Ht 67.5 in | Wt 215.4 lb

## 2019-03-24 DIAGNOSIS — I4891 Unspecified atrial fibrillation: Secondary | ICD-10-CM

## 2019-03-24 DIAGNOSIS — E782 Mixed hyperlipidemia: Secondary | ICD-10-CM

## 2019-03-24 DIAGNOSIS — Z7901 Long term (current) use of anticoagulants: Secondary | ICD-10-CM

## 2019-03-24 NOTE — Progress Notes (Signed)
OFFICE NOTE  Chief Complaint:  Follow-up cardioversion  Primary Care Physician: [name], MD  HPI:  Frank Hudson is a 68 y.o. male with a past medial history significant for PMR, BPH and knee osteoarthritis.  Recently he was found to be in atrial fibrillation.  This was thought to be new onset.  He was referred for cardioversion which I performed on 06/01/2018.  Unfortunately he did not convert despite several attempts and a long 4 to 5-second pause after shock, maintaining atrial fibrillation.  The reason he did not convert is unclear.  He underwent a recent echo which showed normal heart function and mildly dilated left atrium.  He is asymptomatic with his A. fib, denying palpitations, fatigue, shortness of breath with exertion or chest pain.  He underwent surgery for his knee which was uneventful.  Since then he continues to do well.  He has had no issues on anticoagulation.  Rate is well controlled.  He is planning on undergoing an upcoming manipulation procedure by a surgeon, I suspect for range of motion which would require coming off of anticoagulation.  I recommended that it was okay to discontinue anticoagulation for 2 days prior to the procedure only after 1 month following the cardioversion (or after 07/02/2018).  03/24/2019  Frank Hudson returns today for follow-up.  Overall he is doing well.  He is really asymptomatic with regards to his A. fib.  He remains in A. fib and is rate controlled.  He started exercise and walk more.  He is tolerating Eliquis without bleeding problems.  He did show me some lipids from his primary care provider which showed an LDL in the 140s and total just over 200.  He was advised because of intermediate risk to go on statin therapy.  This is likely primarily based on his age as the A. fib does not necessarily figure into that risk.  He does not have any history of hypertension, diabetes or other cardiovascular risk factors.  Nonetheless, the target LDL  should be likely less than 130 or if more aggressive less than 100.  He may not be able to achieve this with diet alone.  PMHx:  Past Medical History:  Diagnosis Date  . Arthritis   . BPH (benign prostatic hyperplasia)   . Osteoarthritis of right knee    End Stage  . PMR (polymyalgia rheumatica) (HCC)     Past Surgical History:  Procedure Laterality Date  . APPENDECTOMY    . CARDIOVERSION N/A 06/01/2018   Procedure: CARDIOVERSION;  Surgeon: [name], MD;  Location: [place];  Service: Cardiovascular;  Laterality: N/A;  . ELBOW SURGERY Left 2015  . MENISCECTOMY Right 1978?  . TOTAL KNEE ARTHROPLASTY Right 04/22/2018   Procedure: RIGHT TOTAL KNEE ARTHROPLASTY;  Surgeon: [name], MD;  Location: [place];  Service: Orthopedics;  Laterality: Right;    FAMHx:  No family history on file.  SOCHx:   reports that he quit smoking about 16 years ago. His smoking use included cigarettes and cigars. He has never used smokeless tobacco. He reports current alcohol use of about 14.0 standard drinks of alcohol per week. No history on file for drug.  ALLERGIES:  Allergies  Allergen Reactions  . Amoxicillin Rash    DID THE REACTION INVOLVE: Swelling of the face/tongue/throat, SOB, or low BP? No Sudden or severe rash/hives, skin peeling, or the inside of the mouth or nose? Yes Did it require medical treatment? Yes When did it last happen?15 years If  all above answers are "NO", may proceed with cephalosporin use.     ROS: Pertinent items noted in HPI and remainder of comprehensive ROS otherwise negative.  HOME MEDS: Current Outpatient Medications on File Prior to Visit  Medication Sig Dispense Refill  . apixaban (ELIQUIS) 5 MG TABS tablet Take 1 tablet (5 mg total) by mouth 2 (two) times daily. 60 tablet 0  . dutasteride (AVODART) 0.5 MG capsule Take 0.5 mg by mouth every other day.    . metoprolol tartrate (LOPRESSOR) 25 MG tablet Take 0.5 tablets (12.5 mg total) by  mouth 2 (two) times daily. 180 tablet 1   No current facility-administered medications on file prior to visit.     LABS/IMAGING: No results found for this or any previous visit (from the past 48 hour(s)). No results found.  LIPID PANEL: No results found for: CHOL, TRIG, HDL, CHOLHDL, VLDL, LDLCALC, LDLDIRECT   WEIGHTS: Wt Readings from Last 3 Encounters:  03/24/19 215 lb 6.4 oz (97.7 kg)  06/21/18 201 lb 3.2 oz (91.3 kg)  06/01/18 202 lb (91.6 kg)    VITALS: BP 135/88   Pulse 60   Temp (!) 97.3 F (36.3 C)   Ht 5' 7.5" (1.715 m)   Wt 215 lb 6.4 oz (97.7 kg)   SpO2 99%   BMI 33.24 kg/m   EXAM: General appearance: alert and no distress Neck: no carotid bruit, no JVD and thyroid not enlarged, symmetric, no tenderness/mass/nodules Lungs: clear to auscultation bilaterally Heart: regular rate and rhythm, S1, S2 normal, no murmur, click, rub or gallop Abdomen: soft, non-tender; bowel sounds normal; no masses,  no organomegaly Extremities: extremities normal, atraumatic, no cyanosis or edema Pulses: 2+ and symmetric Skin: Skin color, texture, turgor normal. No rashes or lesions Neurologic: Grossly normal Psych: Pleasant  EKG: A. fib at 62-personally reviewed  ASSESSMENT: 1. Persistent atrial fibrillation, refractory to cardioversion (CHADSVASC score of 3) 2. Normal LVEF 60 to 65%, mild LAE (05/2018) 3. Acceptable risk for upcoming procedure 4. Mixed dyslipidemia-goal LDL less than 100  PLAN: 1.   Frank Hudson remains in rate controlled atrial fibrillation.  He did not convert with a cardioversion.  He is asymptomatic with it therefore we will continue rate control and anticoagulation.  He does have mixed dyslipidemia with target LDL less than 100 and I encouraged additional weight loss and dietary measures to see if this improves his risk.  If there is no significant improvement in 6 months, consideration should be given for moderate potency statin such as atorvastatin 20mg ,  rosuvastatin 10 mg or pravastatin 20 to 40 mg daily.  Plan follow-up with me annually or sooner as necessary.  [name], MD, [facility], [place] Director of the Advanced Lipid Disorders &  Cardiovascular Risk Reduction Clinic Diplomate of the American Board of Clinical Lipidology Attending Cardiologist  Direct Dial: [phone]  Fax: [phone]  Website:  [email or web address] 03/24/2019, 8:33 AM

## 2019-03-24 NOTE — Patient Instructions (Addendum)
Medication Instructions:  Your physician recommends that you continue on your current medications as directed. Please refer to the Current Medication list given to you today.  *If you need a refill on your cardiac medications before your next appointment, please call your pharmacy*  Lab Work: NONE If you have labs (blood work) drawn today and your tests are completely normal, you will receive your results only by: Marland Kitchen MyChart Message (if you have MyChart) OR . A paper copy in the mail If you have any lab test that is abnormal or we need to change your treatment, we will call you to review the results.  Testing/Procedures: NONE  Follow-Up: At [facility], you and your health needs are our priority.  As part of our continuing mission to provide you with exceptional heart care, we have created designated Provider Care Teams.  These Care Teams include your primary Cardiologist (physician) and Advanced Practice Providers (APPs -  Physician Assistants and Nurse Practitioners) who all work together to provide you with the care you need, when you need it.  Your next appointment:   12 months  The format for your next appointment:   In Person  Provider:   K. Mali Hilty, MD  Other Instructions

## 2019-04-12 DIAGNOSIS — H11153 Pinguecula, bilateral: Secondary | ICD-10-CM | POA: Diagnosis not present

## 2019-04-12 DIAGNOSIS — H5203 Hypermetropia, bilateral: Secondary | ICD-10-CM | POA: Diagnosis not present

## 2019-04-12 DIAGNOSIS — H02831 Dermatochalasis of right upper eyelid: Secondary | ICD-10-CM | POA: Diagnosis not present

## 2019-04-12 DIAGNOSIS — H52203 Unspecified astigmatism, bilateral: Secondary | ICD-10-CM | POA: Diagnosis not present

## 2019-04-12 DIAGNOSIS — H2513 Age-related nuclear cataract, bilateral: Secondary | ICD-10-CM | POA: Diagnosis not present

## 2019-04-12 DIAGNOSIS — H524 Presbyopia: Secondary | ICD-10-CM | POA: Diagnosis not present

## 2019-04-12 DIAGNOSIS — H02834 Dermatochalasis of left upper eyelid: Secondary | ICD-10-CM | POA: Diagnosis not present

## 2019-05-08 ENCOUNTER — Other Ambulatory Visit: Payer: Self-pay

## 2019-05-08 MED ORDER — METOPROLOL TARTRATE 25 MG PO TABS: 12.5000 mg | ORAL_TABLET | Freq: Two times a day (BID) | ORAL | 1 refills | Status: DC

## 2019-05-08 NOTE — Telephone Encounter (Signed)
Error

## 2020-03-26 ENCOUNTER — Other Ambulatory Visit: Payer: Self-pay

## 2020-03-26 ENCOUNTER — Ambulatory Visit: Payer: Medicare Other | Admitting: Internal Medicine

## 2020-03-26 VITALS — BP 120/89 | HR 76 | Ht 67.0 in | Wt 222.0 lb

## 2020-03-26 DIAGNOSIS — I4891 Unspecified atrial fibrillation: Secondary | ICD-10-CM

## 2020-03-26 DIAGNOSIS — Z7901 Long term (current) use of anticoagulants: Secondary | ICD-10-CM | POA: Diagnosis not present

## 2020-03-26 DIAGNOSIS — E782 Mixed hyperlipidemia: Secondary | ICD-10-CM | POA: Diagnosis not present

## 2020-03-26 NOTE — Patient Instructions (Signed)

## 2020-03-26 NOTE — Progress Notes (Signed)
OFFICE NOTE  Chief Complaint:  Follow-up  Primary Care Physician: [name], MD  HPI:  Frank Hudson is a 69 y.o. male with a past medial history significant for PMR, BPH and knee osteoarthritis.  Recently he was found to be in atrial fibrillation.  This was thought to be new onset.  He was referred for cardioversion which I performed on 06/01/2018.  Unfortunately he did not convert despite several attempts and a long 4 to 5-second pause after shock, maintaining atrial fibrillation.  The reason he did not convert is unclear.  He underwent a recent echo which showed normal heart function and mildly dilated left atrium.  He is asymptomatic with his A. fib, denying palpitations, fatigue, shortness of breath with exertion or chest pain.  He underwent surgery for his knee which was uneventful.  Since then he continues to do well.  He has had no issues on anticoagulation.  Rate is well controlled.  He is planning on undergoing an upcoming manipulation procedure by a surgeon, I suspect for range of motion which would require coming off of anticoagulation.  I recommended that it was okay to discontinue anticoagulation for 2 days prior to the procedure only after 1 month following the cardioversion (or after 07/02/2018).  03/24/2019  Frank Hudson returns today for follow-up.  Overall he is doing well.  He is really asymptomatic with regards to his A. fib.  He remains in A. fib and is rate controlled.  He started exercise and walk more.  He is tolerating Eliquis without bleeding problems.  He did show me some lipids from his primary care provider which showed an LDL in the 140s and total just over 200.  He was advised because of intermediate risk to go on statin therapy.  This is likely primarily based on his age as the A. fib does not necessarily figure into that risk.  He does not have any history of hypertension, diabetes or other cardiovascular risk factors.  Nonetheless, the target LDL should be  likely less than 130 or if more aggressive less than 100.  He may not be able to achieve this with diet alone.  03/26/2020  Frank Hudson returns today for follow-up.  He continues to be asymptomatic with regards to A. fib which is rate controlled.  Blood pressure initially was elevated today however improved to 120/89.  He is on simvastatin and had lipids in April through his PCP which showed total cholesterol 152, triglycerides 114, HDL 42 and LDL 89.  He did report an episode of left ankle edema after being on a long car ride to [place].  He said it resolved the next day.  He is also had that occasionally when he was at the beach.  I reassured him that DVT is very unlikely since he is on the Eliquis.  I suspect this was venous hypertension.  PMHx:  Past Medical History:  Diagnosis Date  . Arthritis   . BPH (benign prostatic hyperplasia)   . Osteoarthritis of right knee    End Stage  . PMR (polymyalgia rheumatica) (HCC)     Past Surgical History:  Procedure Laterality Date  . APPENDECTOMY    . CARDIOVERSION N/A 06/01/2018   Procedure: CARDIOVERSION;  Surgeon: [name], MD;  Location: [place];  Service: Cardiovascular;  Laterality: N/A;  . ELBOW SURGERY Left 2015  . MENISCECTOMY Right 1978?  . TOTAL KNEE ARTHROPLASTY Right 04/22/2018   Procedure: RIGHT TOTAL KNEE ARTHROPLASTY;  Surgeon: [name], MD;  Location: [place];  Service: Orthopedics;  Laterality: Right;    FAMHx:  No family history on file.  SOCHx:   reports that he quit smoking about 17 years ago. His smoking use included cigarettes and cigars. He has never used smokeless tobacco. He reports current alcohol use of about 14.0 standard drinks of alcohol per week. No history on file for drug use.  ALLERGIES:  Allergies  Allergen Reactions  . Amoxicillin Rash    DID THE REACTION INVOLVE: Swelling of the face/tongue/throat, SOB, or low BP? No Sudden or severe rash/hives, skin peeling, or the inside of the mouth  or nose? Yes Did it require medical treatment? Yes When did it last happen?15 years If all above answers are "NO", may proceed with cephalosporin use.     ROS: Pertinent items noted in HPI and remainder of comprehensive ROS otherwise negative.  HOME MEDS: Current Outpatient Medications on File Prior to Visit  Medication Sig Dispense Refill  . apixaban (ELIQUIS) 5 MG TABS tablet Take 1 tablet (5 mg total) by mouth 2 (two) times daily. 60 tablet 0  . dutasteride (AVODART) 0.5 MG capsule Take 0.5 mg by mouth every other day.    . metoprolol tartrate (LOPRESSOR) 25 MG tablet Take 0.5 tablets (12.5 mg total) by mouth 2 (two) times daily. 180 tablet 1  . simvastatin (ZOCOR) 20 MG tablet Take by mouth.     No current facility-administered medications on file prior to visit.    LABS/IMAGING: No results found for this or any previous visit (from the past 48 hour(s)). No results found.  LIPID PANEL: No results found for: CHOL, TRIG, HDL, CHOLHDL, VLDL, LDLCALC, LDLDIRECT   WEIGHTS: Wt Readings from Last 3 Encounters:  03/26/20 222 lb (100.7 kg)  03/24/19 215 lb 6.4 oz (97.7 kg)  06/21/18 201 lb 3.2 oz (91.3 kg)    VITALS: BP (!) 138/100   Pulse 76   Ht 5\' 7"  (1.702 m)   Wt 222 lb (100.7 kg)   SpO2 99%   BMI 34.77 kg/m   EXAM: General appearance: alert and no distress Neck: no carotid bruit, no JVD and thyroid not enlarged, symmetric, no tenderness/mass/nodules Lungs: clear to auscultation bilaterally Heart: regular rate and rhythm, S1, S2 normal, no murmur, click, rub or gallop Abdomen: soft, non-tender; bowel sounds normal; no masses,  no organomegaly Extremities: extremities normal, atraumatic, no cyanosis or edema Pulses: 2+ and symmetric Skin: Skin color, texture, turgor normal. No rashes or lesions Neurologic: Grossly normal Psych: Pleasant  EKG: A. fib at 76-personally reviewed  ASSESSMENT: 1. Persistent atrial fibrillation, refractory to cardioversion  (CHADSVASC score of 3) 2. Normal LVEF 60 to 65%, mild LAE (05/2018) 3. Mixed dyslipidemia-goal LDL less than 100  PLAN: 1.   Frank Hudson continues to do well on Eliquis.  Denies any bleeding issues.  His A. fib is rate controlled.  Echo in 2020 showed normal systolic function.  He is at target LDL less than 100 on simvastatin.  No changes today.  Plan follow-up with me annually or sooner as necessary.  [name], MD, [facility], [place] Director of the Advanced Lipid Disorders &  Cardiovascular Risk Reduction Clinic Diplomate of the American Board of Clinical Lipidology Attending Cardiologist  Direct Dial: [phone]  Fax: [phone]  Website:  [email or web address] 03/26/2020, 8:13 AM

## 2020-05-09 ENCOUNTER — Other Ambulatory Visit: Payer: Self-pay | Admitting: Physician Assistant

## 2020-09-30 ENCOUNTER — Ambulatory Visit: Payer: Medicare Other | Admitting: Physician Assistant

## 2020-09-30 ENCOUNTER — Other Ambulatory Visit: Payer: Self-pay

## 2020-09-30 ENCOUNTER — Encounter: Payer: Self-pay | Admitting: Physician Assistant

## 2020-09-30 VITALS — BP 134/78 | HR 79 | Ht 67.5 in | Wt 218.0 lb

## 2020-09-30 DIAGNOSIS — I4811 Longstanding persistent atrial fibrillation: Secondary | ICD-10-CM

## 2020-09-30 DIAGNOSIS — Z01810 Encounter for preprocedural cardiovascular examination: Secondary | ICD-10-CM | POA: Diagnosis not present

## 2020-09-30 MED ORDER — APIXABAN 5 MG PO TABS: 5.0000 mg | ORAL_TABLET | Freq: Two times a day (BID) | ORAL | 3 refills | Status: DC

## 2020-09-30 NOTE — Patient Instructions (Signed)
Medication Instructions:   HOLD Eliquis for 2 days prior to surgery. RESTART as soon as possible as the discretion of the surgeon.  *If you need a refill on your cardiac medications before your next appointment, please call your pharmacy*  Lab Work: NONE ordered at this time of appointment   If you have labs (blood work) drawn today and your tests are completely normal, you will receive your results only by: Marland Kitchen MyChart Message (if you have MyChart) OR . A paper copy in the mail If you have any lab test that is abnormal or we need to change your treatment, we will call you to review the results.  Testing/Procedures: NONE ordered at this time of appointment   Follow-Up: At [facility], you and your health needs are our priority.  As part of our continuing mission to provide you with exceptional heart care, we have created designated Provider Care Teams.  These Care Teams include your primary Cardiologist (physician) and Advanced Practice Providers (APPs -  Physician Assistants and Nurse Practitioners) who all work together to provide you with the care you need, when you need it.  We recommend signing up for the patient portal called "MyChart".  Sign up information is provided on this After Visit Summary.  MyChart is used to connect with patients for Virtual Visits (Telemedicine).  Patients are able to view lab/test results, encounter notes, upcoming appointments, etc.  Non-urgent messages can be sent to your provider as well.   To learn more about what you can do with MyChart, go to [email or web address].    Your next appointment:   6 month(s)  The format for your next appointment:   In Person  Provider:   K. Mali Hilty, MD  Other Instructions

## 2020-09-30 NOTE — Progress Notes (Signed)
Cardiology Office Note:    Date:  10/03/2020   ID:  Frank Hudson, DOB 1950/10/11, MRN [id number]  PCP:  [name], MD   [facility] Providers Cardiologist:  [name], MD {  Referring MD: [name], MD   Chief Complaint  Patient presents with  . Follow-up    Seen for Dr. [name]    History of Present Illness:    Frank Hudson is a 70 y.o. male with a hx of polymyalgia rheumatica and BPH who underwent right total knee arthroscopy in 04/2018.  It was incidentally noted patient was in rate controlled atrial fibrillation on the monitor.  He was started on Eliquis 5 mg twice daily.  I last saw the patient in early in January 2020 and set the patient up for a cardioversion on 06/01/2018.  Unfortunately he did not convert despite several attempts and a long 4 to 5-second pause after the shock.  Echocardiogram obtained on 06/10/2018 showed EF 60 to 65%, mild MR, mild LAE, PAP pressure 32 mmHg.  Patient denies any cardiac awareness of atrial fibrillation.  Patient presents today for cardiology visit.  He denies any chest pain or worsening dyspnea.  Patient will clearly require knee surgery in the future.  He has no recent angina.  He is cleared to proceed with surgery after holding Eliquis for 2 days.  Heart rate is quite well controlled on the current therapy.  Overall, he has been doing well from cardiac perspective.  Past Medical History:  Diagnosis Date  . Arthritis   . BPH (benign prostatic hyperplasia)   . Osteoarthritis of right knee    End Stage  . PMR (polymyalgia rheumatica) (HCC)     Past Surgical History:  Procedure Laterality Date  . APPENDECTOMY    . CARDIOVERSION N/A 06/01/2018   Procedure: CARDIOVERSION;  Surgeon: [name], MD;  Location: [place];  Service: Cardiovascular;  Laterality: N/A;  . ELBOW SURGERY Left 2015  . MENISCECTOMY Right 1978?  . TOTAL KNEE ARTHROPLASTY Right 04/22/2018   Procedure: RIGHT TOTAL KNEE ARTHROPLASTY;   Surgeon: [name], MD;  Location: [place];  Service: Orthopedics;  Laterality: Right;    Current Medications: Current Meds  Medication Sig  . dutasteride (AVODART) 0.5 MG capsule Take 0.5 mg by mouth every other day.  . simvastatin (ZOCOR) 20 MG tablet Take by mouth.  . [DISCONTINUED] apixaban (ELIQUIS) 5 MG TABS tablet Take 1 tablet (5 mg total) by mouth 2 (two) times daily.     Allergies:   Amoxicillin   Social History   Socioeconomic History  . Marital status: Married    Spouse name: Not on file  . Number of children: Not on file  . Years of education: Not on file  . Highest education level: Not on file  Occupational History  . Not on file  Tobacco Use  . Smoking status: Former Smoker    Types: Cigarettes, Cigars    Quit date: 2004    Years since quitting: 18.3  . Smokeless tobacco: Never Used  Vaping Use  . Vaping Use: Never used  Substance and Sexual Activity  . Alcohol use: Yes    Alcohol/week: 14.0 standard drinks    Types: 14 Standard drinks or equivalent per week  . Drug use: Not on file  . Sexual activity: Not on file  Other Topics Concern  . Not on file  Social History Narrative  . Not on file   Social Determinants of Health  Financial Resource Strain: Not on file  Food Insecurity: Not on file  Transportation Needs: Not on file  Physical Activity: Not on file  Stress: Not on file  Social Connections: Not on file     Family History: The patient's family history is not on file.  ROS:   Please see the history of present illness.     All other systems reviewed and are negative.  EKGs/Labs/Other Studies Reviewed:    The following studies were reviewed today:  Echo 06/10/2018 LV EF: 60% -  65%   -------------------------------------------------------------------  Indications:   Atrial fibrillation (I48.91).   -------------------------------------------------------------------  History:  PMH:  Atrial fibrillation.    -------------------------------------------------------------------  Study Conclusions   - Left ventricle: The cavity size was normal. Wall thickness was  normal. Systolic function was normal. The estimated ejection  fraction was in the range of 60% to 65%. The study is not  technically sufficient to allow evaluation of LV diastolic  function.  - Mitral valve: There was mild regurgitation.  - Left atrium: The atrium was mildly dilated.  - Atrial septum: No defect or patent foramen ovale was identified.  - Pulmonary arteries: PA peak pressure: 32 mm Hg (S).   EKG:  EKG is not ordered today.    Recent Labs: No results found for requested labs within last 8760 hours.  Recent Lipid Panel No results found for: CHOL, TRIG, HDL, CHOLHDL, VLDL, LDLCALC, LDLDIRECT   Risk Assessment/Calculations:    CHA2DS2-VASc Score = 1  This indicates a 0.6% annual risk of stroke. The patient's score is based upon: CHF History: No HTN History: No Diabetes History: No Stroke History: No Vascular Disease History: No Age Score: 1 Gender Score: 0       Physical Exam:    VS:  BP 134/78   Pulse 79   Ht 5' 7.5" (1.715 m)   Wt 218 lb (98.9 kg)   SpO2 97%   BMI 33.64 kg/m     Wt Readings from Last 3 Encounters:  09/30/20 218 lb (98.9 kg)  03/26/20 222 lb (100.7 kg)  03/24/19 215 lb 6.4 oz (97.7 kg)     GEN:  Well nourished, well developed in no acute distress HEENT: Normal NECK: No JVD; No carotid bruits LYMPHATICS: No lymphadenopathy CARDIAC: Irregularly irregular, no murmurs, rubs, gallops RESPIRATORY:  Clear to auscultation without rales, wheezing or rhonchi  ABDOMEN: Soft, non-tender, non-distended MUSCULOSKELETAL:  No edema; No deformity  SKIN: Warm and dry NEUROLOGIC:  Alert and oriented x 3 PSYCHIATRIC:  Normal affect   ASSESSMENT:    1. Preop cardiovascular exam   2. Longstanding persistent atrial fibrillation (HCC)    PLAN:    In order of problems listed  above:  1. Preoperative clearance: Patient has upcoming knee surgery.  He denies any recent exertional chest discomfort or worsening dyspnea.  He is cleared to proceed with the surgery.  He will need to hold Eliquis for 2 days prior to the surgery and restart as soon as possible afterward at the surgeon's discretion  2. Longstanding persistent atrial fibrillation: He has failed cardioversion despite multiple tries.  He has no cardiac awareness of atrial fibrillation.  Continue metoprolol and Eliquis.  May hold Eliquis prior to surgery.        Medication Adjustments/Labs and Tests Ordered: Current medicines are reviewed at length with the patient today.  Concerns regarding medicines are outlined above.  No orders of the defined types were placed in this encounter.  No orders of  the defined types were placed in this encounter.   Patient Instructions  Medication Instructions:   HOLD Eliquis for 2 days prior to surgery. RESTART as soon as possible as the discretion of the surgeon.  *If you need a refill on your cardiac medications before your next appointment, please call your pharmacy*  Lab Work: NONE ordered at this time of appointment   If you have labs (blood work) drawn today and your tests are completely normal, you will receive your results only by: Marland Kitchen MyChart Message (if you have MyChart) OR . A paper copy in the mail If you have any lab test that is abnormal or we need to change your treatment, we will call you to review the results.  Testing/Procedures: NONE ordered at this time of appointment   Follow-Up: At [facility], you and your health needs are our priority.  As part of our continuing mission to provide you with exceptional heart care, we have created designated Provider Care Teams.  These Care Teams include your primary Cardiologist (physician) and Advanced Practice Providers (APPs -  Physician Assistants and Nurse Practitioners) who all work together to provide  you with the care you need, when you need it.  We recommend signing up for the patient portal called "MyChart".  Sign up information is provided on this After Visit Summary.  MyChart is used to connect with patients for Virtual Visits (Telemedicine).  Patients are able to view lab/test results, encounter notes, upcoming appointments, etc.  Non-urgent messages can be sent to your provider as well.   To learn more about what you can do with MyChart, go to [email or web address].    Your next appointment:   6 month(s)  The format for your next appointment:   In Person  Provider:   Raliegh Ip Mali Hilty, MD  Other Instructions      Signed, Almyra Deforest, [place]  10/03/2020 12:12 AM    [place]

## 2020-10-03 ENCOUNTER — Encounter: Payer: Self-pay | Admitting: Physician Assistant

## 2020-10-07 ENCOUNTER — Telehealth: Payer: Self-pay | Admitting: Internal Medicine

## 2020-10-07 NOTE — Telephone Encounter (Signed)
Pt seen by Almyra Deforest, PA-C on 5/16 and clearance provided at that time.  Notes faxed to surgeon. This phone note will be removed from the preop pool. Richardson Dopp, PA-C  10/07/2020 2:23 PM

## 2020-10-07 NOTE — Telephone Encounter (Signed)
   [facility] Pre-operative Risk Assessment    Patient Name: Frank Hudson  DOB: May 09, 1951  MRN: [id number]   Request for surgical clearance:  1. What type of surgery is being performed? Left knee scope   2. When is this surgery scheduled? TBD   3. What type of clearance is required (medical clearance vs. Pharmacy clearance to hold med vs. Both)? Both   4. Are there any medications that need to be held prior to surgery and how long? Eliquis   5. Practice name and name of physician performing surgery? Dr. Esmond Plants with [facility]   6. What is the office phone number? [phone]   7.   What is the office fax number? [phone] (Attn: Adline Potter)  8.   Anesthesia type (None, local, MAC, general) ? Choice   Sheral Apley M 10/07/2020, 12:56 PM  _________________________________________________________________   (provider comments below)

## 2020-10-08 NOTE — Addendum Note (Signed)
Addended by: Jacqulynn Cadet on: 10/08/2020 10:32 AM   Modules accepted: Orders

## 2020-10-30 IMAGING — DX DG KNEE 1-2V PORT*R*
2 series · 2 of 2 positions shown · non-contrast
Comparison: None.

CLINICAL DATA: S/p RIGHT total knee replacement.

EXAM:
PORTABLE RIGHT KNEE - 1-2 VIEW

[knee ap]
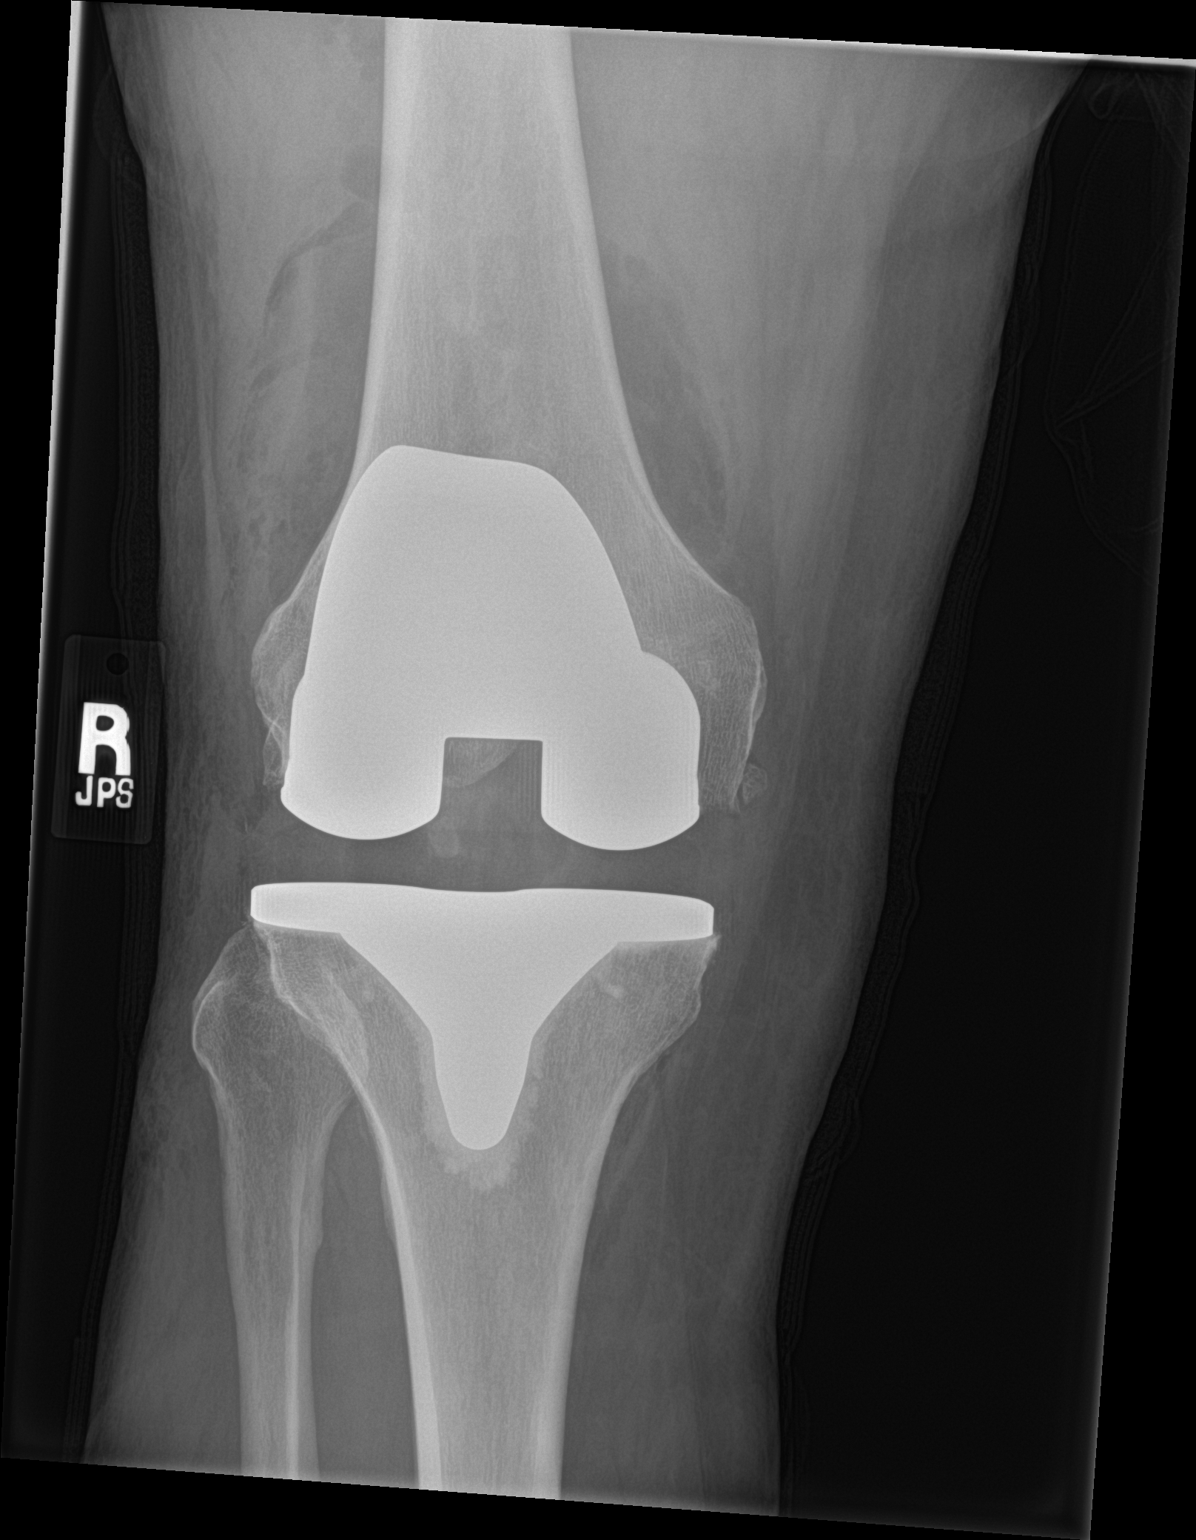

[knee lat]
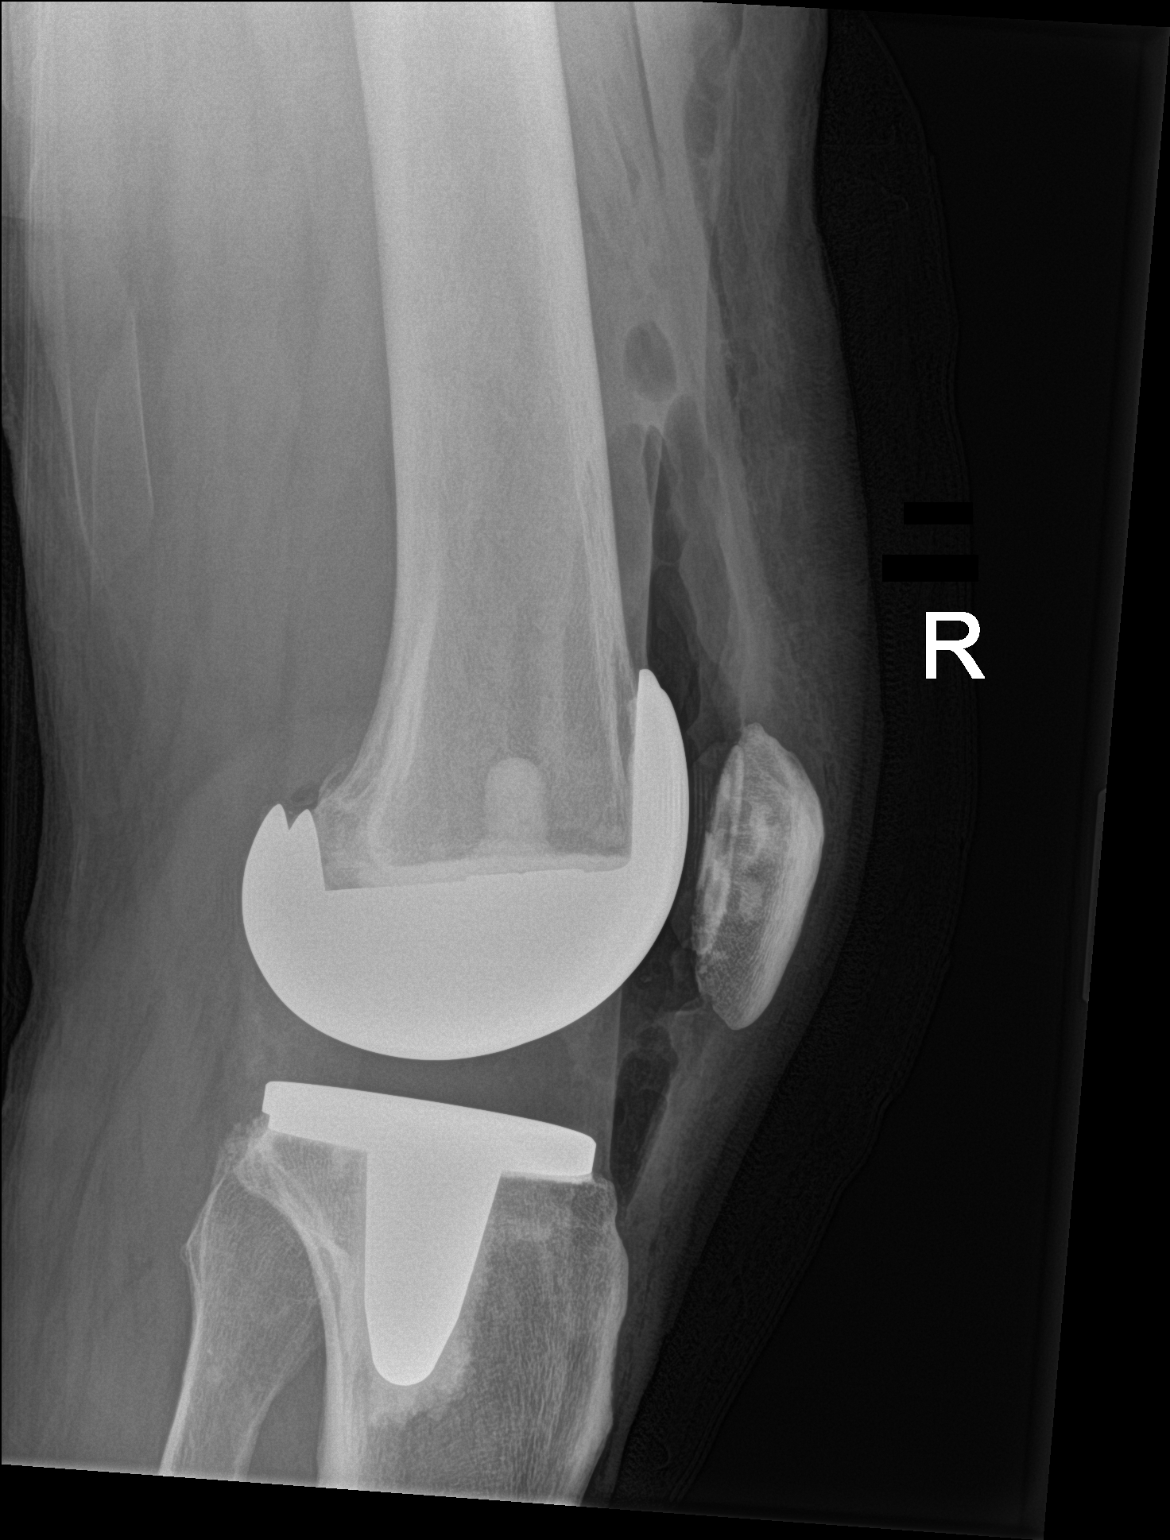

[2 of 2 positions shown; findings below may reference images not displayed]

FINDINGS: RIGHT total knee replacement identified without complicating
features. Postoperative changes in the soft tissues are noted.

No acute fracture or dislocation.
IMPRESSION: .  RIGHT total knee replacement without complicating features.

## 2021-06-06 ENCOUNTER — Telehealth: Payer: Self-pay | Admitting: Internal Medicine

## 2021-06-06 NOTE — Telephone Encounter (Signed)
Patient with diagnosis of atrial fibrillation on Eliquis for anticoagulation.    Procedure: left total knee Date of procedure: TBD   CHA2DS2-VASc Score = 1   This indicates a 0.6% annual risk of stroke. The patient's score is based upon: CHF History: 0 HTN History: 0 Diabetes History: 0 Stroke History: 0 Vascular Disease History: 0 Age Score: 1 Gender Score: 0      CrCl 93 Platelet count 172  Per office protocol, patient can hold Eliquis for 3 days prior to procedure.   Patient will not need bridging with Lovenox (enoxaparin) around procedure.  For orthopedic procedures please be sure to resume therapeutic (not prophylactic) dosing.

## 2021-06-06 NOTE — Telephone Encounter (Signed)
° °  Pre-operative Risk Assessment    Patient Name: Frank Hudson  DOB: February 01, 1951 MRN: [id number]      Request for Surgical Clearance    Procedure:  left total knee arthroplasty   Date of Surgery:  Clearance TBD                                 Surgeon:  Murrell Redden, MD Surgeon's Group or Practice Name:  [facility]  Phone number:  [phone] Fax number:  [phone] (attn: Mertie Clause)   Type of Clearance Requested:   - Medical  - Pharmacy:  Hold Apixaban (Eliquis) -- unspecified    Type of Anesthesia:  Spinal   Additional requests/questions:  Please fax a copy of all lab results, EKG, etc to the surgeon's office.  Eliseo Gum M   06/06/2021, 1:54 PM

## 2021-06-09 NOTE — Telephone Encounter (Signed)
Pt has appt with Dr. [name] 07/03/21 @ 8:15. I have added pre op clearance needed to appt notes. Will send FYI to requesting office the pt has appt 07/03/21 with Dr. [name].

## 2021-06-09 NOTE — Telephone Encounter (Signed)
Primary Cardiologist:Kenneth C Hilty, MD  Chart reviewed as part of pre-operative protocol coverage. Because of Rishab Stoudt Pettijohn's past medical history and time since last visit, he/she will require a follow-up visit in order to better assess preoperative cardiovascular risk.  Pre-op covering staff: - Please schedule appointment and call patient to inform them. - Please contact requesting surgeon's office via preferred method (i.e, phone, fax) to inform them of need for appointment prior to surgery.  If applicable, this message will also be routed to pharmacy pool and/or primary cardiologist for input on holding anticoagulant/antiplatelet agent as requested below so that this information is available at time of patient's appointment.   Deberah Pelton, NP  06/09/2021, 9:44 AM

## 2021-06-23 ENCOUNTER — Encounter: Payer: Self-pay | Admitting: Physician Assistant

## 2021-07-03 ENCOUNTER — Ambulatory Visit: Payer: Medicare Other | Admitting: Internal Medicine

## 2021-07-03 ENCOUNTER — Encounter: Payer: Self-pay | Admitting: Internal Medicine

## 2021-07-03 ENCOUNTER — Other Ambulatory Visit: Payer: Self-pay

## 2021-07-03 VITALS — BP 116/74 | HR 74 | Ht 67.5 in | Wt 229.0 lb

## 2021-07-03 DIAGNOSIS — E782 Mixed hyperlipidemia: Secondary | ICD-10-CM

## 2021-07-03 DIAGNOSIS — Z7901 Long term (current) use of anticoagulants: Secondary | ICD-10-CM | POA: Diagnosis not present

## 2021-07-03 DIAGNOSIS — I4821 Permanent atrial fibrillation: Secondary | ICD-10-CM

## 2021-07-03 NOTE — Progress Notes (Signed)
OFFICE NOTE  Chief Complaint:  Follow-up  Primary Care Physician: [name], MD  HPI:  Frank Hudson is a 71 y.o. male with a past medial history significant for PMR, BPH and knee osteoarthritis.  Recently he was found to be in atrial fibrillation.  This was thought to be new onset.  He was referred for cardioversion which I performed on 06/01/2018.  Unfortunately he did not convert despite several attempts and a long 4 to 5-second pause after shock, maintaining atrial fibrillation.  The reason he did not convert is unclear.  He underwent a recent echo which showed normal heart function and mildly dilated left atrium.  He is asymptomatic with his A. fib, denying palpitations, fatigue, shortness of breath with exertion or chest pain.  He underwent surgery for his knee which was uneventful.  Since then he continues to do well.  He has had no issues on anticoagulation.  Rate is well controlled.  He is planning on undergoing an upcoming manipulation procedure by a surgeon, I suspect for range of motion which would require coming off of anticoagulation.  I recommended that it was okay to discontinue anticoagulation for 2 days prior to the procedure only after 1 month following the cardioversion (or after 07/02/2018).  03/24/2019  Frank Hudson returns today for follow-up.  Overall he is doing well.  He is really asymptomatic with regards to his A. fib.  He remains in A. fib and is rate controlled.  He started exercise and walk more.  He is tolerating Eliquis without bleeding problems.  He did show me some lipids from his primary care provider which showed an LDL in the 140s and total just over 200.  He was advised because of intermediate risk to go on statin therapy.  This is likely primarily based on his age as the A. fib does not necessarily figure into that risk.  He does not have any history of hypertension, diabetes or other cardiovascular risk factors.  Nonetheless, the target LDL should be  likely less than 130 or if more aggressive less than 100.  He may not be able to achieve this with diet alone.  03/26/2020  Frank Hudson returns today for follow-up.  He continues to be asymptomatic with regards to A. fib which is rate controlled.  Blood pressure initially was elevated today however improved to 120/89.  He is on simvastatin and had lipids in April through his PCP which showed total cholesterol 152, triglycerides 114, HDL 42 and LDL 89.  He did report an episode of left ankle edema after being on a long car ride to [place].  He said it resolved the next day.  He is also had that occasionally when [place] was at the beach.  I reassured him that DVT is very unlikely since he is on the Eliquis.  I suspect this was venous hypertension.  07/03/2021  Frank Hudson returns today for follow-up.  Overall he seems to be doing well.  He struggling somewhat with left knee pain.  He is a prior right knee surgery but is trying to avoid any surgery on his left knee.  [place]  He is try to get his primary care in there.  He was seen by [facility] down in [place].  He is in persistent A-fib, likely permanent at this point.  EKG shows rate controlled A-fib today.  Blood pressure is well controlled.  He is on Eliquis with no bleeding  issues  PMHx:  Past Medical History:  Diagnosis Date   Arthritis    BPH (benign prostatic hyperplasia)    Osteoarthritis of right knee    End Stage   PMR (polymyalgia rheumatica) (HCC)     Past Surgical History:  Procedure Laterality Date   APPENDECTOMY     CARDIOVERSION N/A 06/01/2018   Procedure: CARDIOVERSION;  Surgeon: [name], MD;  Location: [facility];  Service: Cardiovascular;  Laterality: N/A;   ELBOW SURGERY Left 2015   MENISCECTOMY Right 1978?   TOTAL KNEE ARTHROPLASTY Right 04/22/2018   Procedure: RIGHT TOTAL KNEE ARTHROPLASTY;  Surgeon: [name], MD;  Location: [facility];  Service: Orthopedics;   Laterality: Right;    FAMHx:  No family history on file.  SOCHx:   reports that he quit smoking about 19 years ago. His smoking use included cigarettes and cigars. He has never used smokeless tobacco. He reports current alcohol use of about 14.0 standard drinks per week. No history on file for drug use.  ALLERGIES:  Allergies  Allergen Reactions   Amoxicillin Rash    DID THE REACTION INVOLVE: Swelling of the face/tongue/throat, SOB, or low BP? No Sudden or severe rash/hives, skin peeling, or the inside of the mouth or nose? Yes Did it require medical treatment? Yes When did it last happen?      15 years If all above answers are NO, may proceed with cephalosporin use.     ROS: Pertinent items noted in HPI and remainder of comprehensive ROS otherwise negative.  HOME MEDS: Current Outpatient Medications on File Prior to Visit  Medication Sig Dispense Refill   apixaban (ELIQUIS) 5 MG TABS tablet Take 1 tablet (5 mg total) by mouth 2 (two) times daily. 180 tablet 3   dutasteride (AVODART) 0.5 MG capsule Take 0.5 mg by mouth every other day.     simvastatin (ZOCOR) 20 MG tablet Take by mouth.     metoprolol tartrate (LOPRESSOR) 25 MG tablet TAKE 0.5 TABLETS (12.5 MG TOTAL) BY MOUTH 2 (TWO) TIMES DAILY. 180 tablet 3   No current facility-administered medications on file prior to visit.    LABS/IMAGING: No results found for this or any previous visit (from the past 48 hour(s)). No results found.  LIPID PANEL: No results found for: CHOL, TRIG, HDL, CHOLHDL, VLDL, LDLCALC, LDLDIRECT   WEIGHTS: Wt Readings from Last 3 Encounters:  07/03/21 229 lb (103.9 kg)  09/30/20 218 lb (98.9 kg)  03/26/20 222 lb (100.7 kg)    VITALS: BP 116/74 (BP Location: Left Arm, Patient Position: Sitting, Cuff Size: Normal)    Pulse 74    Ht 5' 7.5" (1.715 m)    Wt 229 lb (103.9 kg)    BMI 35.34 kg/m   EXAM: General appearance: alert and no distress Neck: no carotid bruit, no JVD and thyroid  not enlarged, symmetric, no tenderness/mass/nodules Lungs: clear to auscultation bilaterally Heart: regular rate and rhythm, S1, S2 normal, no murmur, click, rub or gallop Abdomen: soft, non-tender; bowel sounds normal; no masses,  no organomegaly Extremities: extremities normal, atraumatic, no cyanosis or edema Pulses: 2+ and symmetric Skin: Skin color, texture, turgor normal. No rashes or lesions Neurologic: Grossly normal Psych: Pleasant  EKG: A. fib at 72-personally reviewed  ASSESSMENT: Permanent atrial fibrillation, refractory to cardioversion (CHADSVASC score of 3) Normal LVEF 60 to 65%, mild LAE (05/2018) Mixed dyslipidemia-goal LDL less than 100  PLAN: 1.   Frank Hudson remains in A-fib which is likely permanent at this point.  After attempts to convert him to normal rhythm failed in the past.  He is asymptomatic with it.  He remains rate controlled.  Echo showed normal LV function in 2020.  He is on Eliquis which is well-tolerated.  He is also on simvastatin at target LDL less than 100.  Metoprolol for rate control.  No medicine changes at this time.  If he should require knee replacement surgery in the future, would advise holding the Eliquis for 3 days prior to the procedure and starting when safe from bleeding standpoint afterwards  Plan follow-up with me annually or sooner as necessary.  [name], MD, [facility], [place] Director of the Advanced Lipid Disorders &  Cardiovascular Risk Reduction Clinic Diplomate of the American Board of Clinical Lipidology Attending Cardiologist  Direct Dial: [phone]   Fax: [phone]  Website:  [email or web address] Frank Hudson 07/03/2021, 8:14 AM

## 2021-07-03 NOTE — Patient Instructions (Signed)
°  Follow-Up: At [facility], you and your health needs are our priority.  As part of our continuing mission to provide you with exceptional heart care, we have created designated Provider Care Teams.  These Care Teams include your primary Cardiologist (physician) and Advanced Practice Providers (APPs -  Physician Assistants and Nurse Practitioners) who all work together to provide you with the care you need, when you need it.  We recommend signing up for the patient portal called "MyChart".  Sign up information is provided on this After Visit Summary.  MyChart is used to connect with patients for Virtual Visits (Telemedicine).  Patients are able to view lab/test results, encounter notes, upcoming appointments, etc.  Non-urgent messages can be sent to your provider as well.   To learn more about what you can do with MyChart, go to [email or web address].    Your next appointment:   12 month(s)  The format for your next appointment:   In Person  Provider:   [name], MD

## 2021-07-26 ENCOUNTER — Other Ambulatory Visit: Payer: Self-pay | Admitting: Internal Medicine

## 2021-10-22 ENCOUNTER — Other Ambulatory Visit: Payer: Self-pay | Admitting: Internal Medicine

## 2021-10-22 DIAGNOSIS — I4819 Other persistent atrial fibrillation: Secondary | ICD-10-CM

## 2021-10-22 NOTE — Telephone Encounter (Signed)
Prescription refill request for Frank Hudson received. Indication: Afib  Last office visit: 07/03/21 (Hilty)  Scr: 0.81 (06/06/21) Age: 70 Weight: 103.9kg  Appropriate dose and refill sent to requested pharmacy.

## 2021-11-19 ENCOUNTER — Telehealth: Payer: Self-pay | Admitting: Internal Medicine

## 2021-11-19 NOTE — Telephone Encounter (Signed)
Pt c/o swelling: STAT is pt has developed SOB within 24 hours  If swelling, where is the swelling located?  Ankles, mainly left ankle  How much weight have you gained and in what time span? NA  Have you gained 3 pounds in a day or 5 pounds in a week? No  Do you have a log of your daily weights (if so, list)? No  Are you currently taking a fluid pill? No  Are you currently SOB? No  Have you traveled recently? No

## 2021-11-19 NOTE — Telephone Encounter (Signed)
Would advise he walk around for a few minutes every hour on the flight - could also wear compression stockings. Not concerned about DVT since he is on Eliquis. Would avoid salt as much as possible and elevate feet. Otherwise, he should be ok to travel.  Dr Lemmie Evens

## 2021-11-19 NOTE — Telephone Encounter (Signed)
Left message for patient of dr hilty's recommendations.

## 2021-11-19 NOTE — Telephone Encounter (Signed)
Spoke with pt, he reports off and on swelling in his feet for the last month. It is usually gone in the morning and back by the end of the day. The left is greater that the right and he reports the right has very little swelling. He reports he is going to [place] and it is a 18  hour flight and he wants to make sure he is okay to travel. He denies SOB or orthopnea. Aware will forward to dr hilty to review and advise.

## 2021-12-15 ENCOUNTER — Encounter: Payer: Self-pay | Admitting: Internal Medicine

## 2022-01-12 ENCOUNTER — Other Ambulatory Visit: Payer: Self-pay | Admitting: Internal Medicine

## 2022-01-12 DIAGNOSIS — R3911 Hesitancy of micturition: Secondary | ICD-10-CM | POA: Diagnosis not present

## 2022-02-17 DIAGNOSIS — H524 Presbyopia: Secondary | ICD-10-CM | POA: Diagnosis not present

## 2022-02-17 DIAGNOSIS — H2513 Age-related nuclear cataract, bilateral: Secondary | ICD-10-CM | POA: Diagnosis not present

## 2022-06-26 ENCOUNTER — Ambulatory Visit: Payer: Medicare Other | Admitting: Nurse Practitioner

## 2022-07-03 ENCOUNTER — Ambulatory Visit: Payer: Medicare HMO | Attending: Internal Medicine | Admitting: Nurse Practitioner

## 2022-07-03 ENCOUNTER — Encounter: Payer: Self-pay | Admitting: Nurse Practitioner

## 2022-07-03 VITALS — BP 124/82 | HR 70 | Ht 67.0 in | Wt 224.8 lb

## 2022-07-03 DIAGNOSIS — Z7901 Long term (current) use of anticoagulants: Secondary | ICD-10-CM

## 2022-07-03 DIAGNOSIS — E782 Mixed hyperlipidemia: Secondary | ICD-10-CM | POA: Diagnosis not present

## 2022-07-03 DIAGNOSIS — I4821 Permanent atrial fibrillation: Secondary | ICD-10-CM

## 2022-07-03 NOTE — Progress Notes (Unsigned)
Office Visit    Patient Name: Frank Hudson Date of Encounter: 07/03/2022  Primary Care Provider:  [name], MD Primary Cardiologist:  [name], MD  Chief Complaint    72 year old male with a history of permanent atrial fibrillation, polymyalgia rheumatica, BPH, and osteoarthritis who presents for follow-up related to atrial fibrillation.  Past Medical History    Past Medical History:  Diagnosis Date   Arthritis    BPH (benign prostatic hyperplasia)    Osteoarthritis of right knee    End Stage   PMR (polymyalgia rheumatica) (HCC)    Past Surgical History:  Procedure Laterality Date   APPENDECTOMY     CARDIOVERSION N/A 06/01/2018   Procedure: CARDIOVERSION;  Surgeon: [name], MD;  Location: [facility];  Service: Cardiovascular;  Laterality: N/A;   ELBOW SURGERY Left 2015   MENISCECTOMY Right 1978?   TOTAL KNEE ARTHROPLASTY Right 04/22/2018   Procedure: RIGHT TOTAL KNEE ARTHROPLASTY;  Surgeon: [name], MD;  Location: [facility];  Service: Orthopedics;  Laterality: Right;    Allergies  Allergies  Allergen Reactions   Amoxicillin Rash    DID THE REACTION INVOLVE: Swelling of the face/tongue/throat, SOB, or low BP? No Sudden or severe rash/hives, skin peeling, or the inside of the mouth or nose? Yes Did it require medical treatment? Yes When did it last happen?      15 years If all above answers are "NO", may proceed with cephalosporin use.      Labs/Other Studies Reviewed    The following studies were reviewed today: Echo 05/2018: Study Conclusions   - Left ventricle: The cavity size was normal. Wall thickness was    normal. Systolic function was normal. The estimated ejection    fraction was in the range of 60% to 65%. The study is not    technically sufficient to allow evaluation of LV diastolic    function.  - Mitral valve: There was mild regurgitation.  - Left atrium: The atrium was mildly dilated.  - Atrial septum: No defect or  patent foramen ovale was identified.  - Pulmonary arteries: PA peak pressure: 32 mm Hg (S).    Recent Labs: No results found for requested labs within last 365 days.  Recent Lipid Panel No results found for: "CHOL", "TRIG", "HDL", "CHOLHDL", "VLDL", "LDLCALC", "LDLDIRECT"  History of Present Illness    72 year old male with the above past medical history including permanent atrial fibrillation, polymyalgia rheumatica, BPH, and osteoarthritis who presents for follow-up related to atrial fibrillation.  He was diagnosed with atrial fibrillation in 2019 in the setting of knee surgery.  He was asymptomatic.  He was started on Eliquis.  He underwent unsuccessful DCCV in 2020.  He has since maintained a rate control strategy.  Echocardiogram in 2020 showed EF 60 to 65%, mild MR, mild LAE, PAP pressure 32 mmHg.  He was last seen in the office on 07/03/2021 and was stable from a cardiac standpoint.   He presents today for follow-up.  Since his last visit he has done well from a cardiac standpoint.  He denies any chest pain, dyspnea, palpitations, dizziness bleeding on Eliquis.  He is spending more time at his [place] home in [place] and is planning on establishing with a cardiologist there as well in case he has needs while he is out of town.  He still plans to follow-up with Dr. [name].  He has an appointment for labs next week to repeat cholesterol.  BP is well-controlled.  Overall, he is doing well.  Follow-up in 1 year.   Permanent atrial fibrillation: Hyperlipidemia: Disposition:   Home Medications    Current Outpatient Medications  Medication Sig Dispense Refill   dutasteride (AVODART) 0.5 MG capsule Take 0.5 mg by mouth every other day.     ELIQUIS 5 MG TABS tablet TAKE 1 TABLET BY MOUTH TWICE A DAY 60 tablet 11   metoprolol tartrate (LOPRESSOR) 25 MG tablet TAKE 1/2 TABLET BY MOUTH TWICE A DAY 90 tablet 1   simvastatin (ZOCOR) 20 MG tablet Take by mouth.     No current  facility-administered medications for this visit.     Review of Systems    ***.  All other systems reviewed and are otherwise negative except as noted above.    Physical Exam    VS:  BP 124/82   Pulse 70   Ht 5' 7"$  (1.702 m)   Wt 224 lb 12.8 oz (102 kg)   SpO2 97%   BMI 35.21 kg/m  GEN: Well nourished, well developed, in no acute distress. HEENT: normal. Neck: Supple, no JVD, carotid bruits, or masses. Cardiac: RRR, no murmurs, rubs, or gallops. No clubbing, cyanosis, edema.  Radials/DP/PT 2+ and equal bilaterally.  Respiratory:  Respirations regular and unlabored, clear to auscultation bilaterally. GI: Soft, nontender, nondistended, BS + x 4. MS: no deformity or atrophy. Skin: warm and dry, no rash. Neuro:  Strength and sensation are intact. Psych: Normal affect.  Accessory Clinical Findings    ECG personally reviewed by me today -atrial fibrillation, 70 bpm- no acute changes.   Lab Results  Component Value Date   WBC 6.0 05/27/2018   HGB 15.3 05/27/2018   HCT 46.3 05/27/2018   MCV 96 05/27/2018   PLT 249 05/27/2018   Lab Results  Component Value Date   CREATININE 0.99 05/27/2018   BUN 15 05/27/2018   NA 143 05/27/2018   K 5.0 05/27/2018   CL 101 05/27/2018   CO2 25 05/27/2018   No results found for: "ALT", "AST", "GGT", "ALKPHOS", "BILITOT" No results found for: "CHOL", "HDL", "LDLCALC", "LDLDIRECT", "TRIG", "CHOLHDL"  No results found for: "HGBA1C"  Assessment & Plan    1.  ***      Lenna Sciara, NP 07/03/2022, 4:35 PM

## 2022-07-03 NOTE — Patient Instructions (Signed)
Medication Instructions:  Your physician recommends that you continue on your current medications as directed. Please refer to the Current Medication list given to you today.   *If you need a refill on your cardiac medications before your next appointment, please call your pharmacy*   Lab Work: NONE ordered at this time of appointment   If you have labs (blood work) drawn today and your tests are completely normal, you will receive your results only by: [place] (if you have MyChart) OR A paper copy in the mail If you have any lab test that is abnormal or we need to change your treatment, we will call you to review the results.   Testing/Procedures: NONE ordered at this time of appointment     Follow-Up: At [facility], you and your health needs are our priority.  As part of our continuing mission to provide you with exceptional heart care, we have created designated Provider Care Teams.  These Care Teams include your primary Cardiologist (physician) and Advanced Practice Providers (APPs -  Physician Assistants and Nurse Practitioners) who all work together to provide you with the care you need, when you need it.  We recommend signing up for the patient portal called "MyChart".  Sign up information is provided on this After Visit Summary.  MyChart is used to connect with patients for Virtual Visits (Telemedicine).  Patients are able to view lab/test results, encounter notes, upcoming appointments, etc.  Non-urgent messages can be sent to your provider as well.   To learn more about what you can do with MyChart, go to [email or web address].    Your next appointment:   1 year(s)  Provider:   [name], MD     Other Instructions

## 2022-07-05 ENCOUNTER — Encounter: Payer: Self-pay | Admitting: Nurse Practitioner

## 2022-07-14 ENCOUNTER — Other Ambulatory Visit: Payer: Self-pay | Admitting: Internal Medicine

## 2022-10-27 ENCOUNTER — Other Ambulatory Visit: Payer: Self-pay | Admitting: Internal Medicine

## 2022-10-27 DIAGNOSIS — I4819 Other persistent atrial fibrillation: Secondary | ICD-10-CM

## 2022-10-27 NOTE — Telephone Encounter (Signed)
Prescription refill request for Eliquis received. Indication: Afib  Last office visit: 07/03/22 ([facility])  Scr: 0.05 (07/09/22 via CareEverywhere)  Age: 72 Weight: 102kg  Appropriate dose. Refill sent.

## 2022-12-07 ENCOUNTER — Ambulatory Visit: Payer: Medicare Other | Admitting: Internal Medicine

## 2023-07-04 ENCOUNTER — Other Ambulatory Visit: Payer: Self-pay | Admitting: Internal Medicine

## 2023-10-01 ENCOUNTER — Other Ambulatory Visit: Payer: Self-pay | Admitting: Internal Medicine

## 2023-10-16 ENCOUNTER — Other Ambulatory Visit: Payer: Self-pay | Admitting: Internal Medicine

## 2023-10-29 ENCOUNTER — Other Ambulatory Visit: Payer: Self-pay | Admitting: Internal Medicine

## 2023-10-29 DIAGNOSIS — I4819 Other persistent atrial fibrillation: Secondary | ICD-10-CM

## 2023-10-29 NOTE — Telephone Encounter (Signed)
*  STAT* If patient is at the pharmacy, call can be transferred to refill team.   1. Which medications need to be refilled? (please list name of each medication and dose if known) new prescription for Eliquis - different pharmacy   2. Would you like to learn more about the convenience, safety, & potential cost savings by using the [facility]?     3. Are you open to using the [facility] (Type [facility]. .   4. Which pharmacy/location (including street and city if local pharmacy) is medication to be sent to?[facility]  [place], [place]   5. Do they need a 30 day or 90 day supply? 90 days and refills- please call in today- out of medicine

## 2024-04-22 ENCOUNTER — Other Ambulatory Visit: Payer: Self-pay | Admitting: Internal Medicine

## 2024-05-12 ENCOUNTER — Other Ambulatory Visit (HOSPITAL_COMMUNITY): Payer: Self-pay

## 2024-05-12 MED ORDER — METOPROLOL TARTRATE 25 MG PO TABS
12.5000 mg | ORAL_TABLET | Freq: Two times a day (BID) | ORAL | 3 refills | Status: AC
  Filled 2024-05-12: qty 90, 90d supply, fill #0

## 2024-05-19 ENCOUNTER — Other Ambulatory Visit: Payer: Self-pay | Admitting: Internal Medicine
